# Patient Record
Sex: Female | Born: 1945
Health system: Southern US, Community
[De-identification: ages and names within clinical notes are randomized; demographics above are authoritative.]

## PROBLEM LIST (undated history)

## (undated) DIAGNOSIS — K219 Gastro-esophageal reflux disease without esophagitis: Secondary | ICD-10-CM

## (undated) DIAGNOSIS — I10 Essential (primary) hypertension: Secondary | ICD-10-CM

## (undated) DIAGNOSIS — E785 Hyperlipidemia, unspecified: Secondary | ICD-10-CM

## (undated) DIAGNOSIS — F419 Anxiety disorder, unspecified: Secondary | ICD-10-CM

## (undated) HISTORY — DX: Hyperlipidemia, unspecified: E78.5

## (undated) HISTORY — DX: Gastro-esophageal reflux disease without esophagitis: K21.9

## (undated) HISTORY — DX: Anxiety disorder, unspecified: F41.9

## (undated) HISTORY — PX: WRIST SURGERY: SHX841

## (undated) HISTORY — PX: OTHER SURGICAL HISTORY: SHX169

---

## 1998-11-09 ENCOUNTER — Ambulatory Visit (HOSPITAL_COMMUNITY): Admission: RE | Admit: 1998-11-09 | Discharge: 1998-11-09 | Payer: Self-pay | Admitting: Obstetrics & Gynecology

## 1998-11-09 ENCOUNTER — Encounter: Payer: Self-pay | Admitting: Obstetrics & Gynecology

## 1998-11-22 ENCOUNTER — Ambulatory Visit (HOSPITAL_COMMUNITY): Admission: RE | Admit: 1998-11-22 | Discharge: 1998-11-22 | Payer: Self-pay | Admitting: Obstetrics & Gynecology

## 1998-11-22 ENCOUNTER — Encounter: Payer: Self-pay | Admitting: Obstetrics and Gynecology

## 1999-04-20 ENCOUNTER — Ambulatory Visit (HOSPITAL_BASED_OUTPATIENT_CLINIC_OR_DEPARTMENT_OTHER): Admission: RE | Admit: 1999-04-20 | Discharge: 1999-04-20 | Payer: Self-pay | Admitting: Orthopedic Surgery

## 2001-10-22 ENCOUNTER — Other Ambulatory Visit: Admission: RE | Admit: 2001-10-22 | Discharge: 2001-10-22 | Payer: Self-pay | Admitting: Obstetrics and Gynecology

## 2004-11-11 ENCOUNTER — Other Ambulatory Visit: Admission: RE | Admit: 2004-11-11 | Discharge: 2004-11-11 | Payer: Self-pay | Admitting: Obstetrics and Gynecology

## 2011-01-13 ENCOUNTER — Encounter: Payer: Self-pay | Admitting: *Deleted

## 2011-01-13 ENCOUNTER — Emergency Department (INDEPENDENT_AMBULATORY_CARE_PROVIDER_SITE_OTHER)
Admission: EM | Admit: 2011-01-13 | Discharge: 2011-01-13 | Disposition: A | Discharge status: Home / Self Care | Payer: Medicare Other | Source: Home / Self Care | Attending: Family Medicine | Admitting: Family Medicine

## 2011-01-13 DIAGNOSIS — I10 Essential (primary) hypertension: Secondary | ICD-10-CM

## 2011-01-13 HISTORY — DX: Essential (primary) hypertension: I10

## 2011-01-13 NOTE — ED Notes (Signed)
Headache     Took  Sudafed   3  Days   Ago      Took  Bp   After  And  It  Was    190/81            Was  Seen    This  Am      Today      And  bp  meds  Increased        Still        Has  Headache          Has  Sensation     Of  Sinus  Congestion

## 2011-01-13 NOTE — ED Provider Notes (Signed)
History     CSN: [id number] Arrival date & time: 01/13/2011  8:31 PM   First MD Initiated Contact with Patient 01/13/11 1942      Chief Complaint  Patient presents with  . Sinusitis    (Consider location/radiation/quality/duration/timing/severity/associated sxs/prior treatment) HPI Comments: The patient presents with hx of cold symptoms . Took sudafed and then noted ha and elevated bp. Did not know there was a relationship. She also drinks caffeine. She has had some sinus congestion. No cough. No fever.   The history is provided by the patient.    Past Medical History  Diagnosis Date  . Hypertension     Past Surgical History  Procedure Date  . Wrist surgery     Family History  Problem Relation Age of Onset  . Hypertension Mother     History  Substance Use Topics  . Smoking status: Never Smoker   . Smokeless tobacco: Not on file  . Alcohol Use: Yes     Rarely    OB History    Grav Para Term Preterm Abortions TAB SAB Ect Mult Living                  Review of Systems  Constitutional: Negative.   Respiratory: Negative.   Cardiovascular: Negative.   Gastrointestinal: Negative.   Genitourinary: Negative.   Skin: Negative.     Allergies  Review of patient's allergies indicates no known allergies.  Home Medications   Current Outpatient Rx  Name Route Sig Dispense Refill  . ATENOLOL 25 MG PO TABS Oral Take 25 mg by mouth daily.      Marland Kitchen LISINOPRIL 20 MG PO TABS Oral Take 20 mg by mouth daily.        BP 172/84  Pulse 70  Temp(Src) 98.6 F (37 C) (Oral)  Resp 18  Physical Exam  Nursing note and vitals reviewed. Constitutional: She appears well-developed and well-nourished.  HENT:  Head: Normocephalic and atraumatic.  Neck: Normal range of motion. Neck supple.  Cardiovascular: Normal rate and regular rhythm.   Pulmonary/Chest: Effort normal and breath sounds normal.  Lymphadenopathy:    She has no cervical adenopathy.  Skin: Skin is warm and  dry.    ED Course  Procedures (including critical care time)  Labs Reviewed - No data to display No results found.   1. Hypertension       MDM          Randa Spike, MD 01/13/11 2126

## 2012-11-25 ENCOUNTER — Other Ambulatory Visit (HOSPITAL_COMMUNITY): Payer: Self-pay | Admitting: Internal Medicine

## 2012-11-25 DIAGNOSIS — Z1231 Encounter for screening mammogram for malignant neoplasm of breast: Secondary | ICD-10-CM

## 2012-12-10 ENCOUNTER — Ambulatory Visit (HOSPITAL_COMMUNITY)
Admission: RE | Admit: 2012-12-10 | Discharge: 2012-12-10 | Disposition: A | Discharge status: Home / Self Care | Payer: Medicare Other | Source: Ambulatory Visit | Attending: Internal Medicine | Admitting: Internal Medicine

## 2012-12-10 DIAGNOSIS — Z1231 Encounter for screening mammogram for malignant neoplasm of breast: Secondary | ICD-10-CM | POA: Insufficient documentation

## 2016-05-24 DIAGNOSIS — R69 Illness, unspecified: Secondary | ICD-10-CM | POA: Diagnosis not present

## 2016-07-27 DIAGNOSIS — H353121 Nonexudative age-related macular degeneration, left eye, early dry stage: Secondary | ICD-10-CM | POA: Diagnosis not present

## 2016-07-27 DIAGNOSIS — H353114 Nonexudative age-related macular degeneration, right eye, advanced atrophic with subfoveal involvement: Secondary | ICD-10-CM | POA: Diagnosis not present

## 2016-07-27 DIAGNOSIS — H353212 Exudative age-related macular degeneration, right eye, with inactive choroidal neovascularization: Secondary | ICD-10-CM | POA: Diagnosis not present

## 2016-07-27 DIAGNOSIS — H35051 Retinal neovascularization, unspecified, right eye: Secondary | ICD-10-CM | POA: Diagnosis not present

## 2016-09-27 DIAGNOSIS — R69 Illness, unspecified: Secondary | ICD-10-CM | POA: Diagnosis not present

## 2016-10-09 DIAGNOSIS — H2511 Age-related nuclear cataract, right eye: Secondary | ICD-10-CM | POA: Diagnosis not present

## 2016-10-09 DIAGNOSIS — H2512 Age-related nuclear cataract, left eye: Secondary | ICD-10-CM | POA: Diagnosis not present

## 2016-10-09 DIAGNOSIS — H353122 Nonexudative age-related macular degeneration, left eye, intermediate dry stage: Secondary | ICD-10-CM | POA: Diagnosis not present

## 2016-10-09 DIAGNOSIS — H353212 Exudative age-related macular degeneration, right eye, with inactive choroidal neovascularization: Secondary | ICD-10-CM | POA: Diagnosis not present

## 2016-11-01 DIAGNOSIS — H2511 Age-related nuclear cataract, right eye: Secondary | ICD-10-CM | POA: Diagnosis not present

## 2016-11-30 DIAGNOSIS — H2512 Age-related nuclear cataract, left eye: Secondary | ICD-10-CM | POA: Diagnosis not present

## 2017-04-11 DIAGNOSIS — R69 Illness, unspecified: Secondary | ICD-10-CM | POA: Diagnosis not present

## 2017-04-13 DIAGNOSIS — 419620001 Death: Secondary | SNOMED CT | POA: Diagnosis not present

## 2017-04-13 DEATH — deceased

## 2017-07-19 DIAGNOSIS — N8111 Cystocele, midline: Secondary | ICD-10-CM | POA: Diagnosis not present

## 2017-07-23 DIAGNOSIS — Z1231 Encounter for screening mammogram for malignant neoplasm of breast: Secondary | ICD-10-CM | POA: Diagnosis not present

## 2017-07-31 DIAGNOSIS — H35051 Retinal neovascularization, unspecified, right eye: Secondary | ICD-10-CM | POA: Diagnosis not present

## 2017-07-31 DIAGNOSIS — H353114 Nonexudative age-related macular degeneration, right eye, advanced atrophic with subfoveal involvement: Secondary | ICD-10-CM | POA: Diagnosis not present

## 2017-07-31 DIAGNOSIS — H353212 Exudative age-related macular degeneration, right eye, with inactive choroidal neovascularization: Secondary | ICD-10-CM | POA: Diagnosis not present

## 2017-07-31 DIAGNOSIS — H353121 Nonexudative age-related macular degeneration, left eye, early dry stage: Secondary | ICD-10-CM | POA: Diagnosis not present

## 2017-08-03 ENCOUNTER — Encounter (HOSPITAL_BASED_OUTPATIENT_CLINIC_OR_DEPARTMENT_OTHER): Payer: Self-pay | Admitting: *Deleted

## 2017-08-08 DIAGNOSIS — R69 Illness, unspecified: Secondary | ICD-10-CM | POA: Diagnosis not present

## 2017-08-22 DIAGNOSIS — Z0189 Encounter for other specified special examinations: Secondary | ICD-10-CM | POA: Diagnosis not present

## 2017-08-22 DIAGNOSIS — N8111 Cystocele, midline: Secondary | ICD-10-CM | POA: Diagnosis not present

## 2017-08-24 NOTE — Patient Instructions (Addendum)
Emily Booth  08/24/2017       Emily Booth  08/27/2017      Your procedure is scheduled on7/25/2019   Report to [facility] [place]  At  0530  A.M.  Call this number if you have problems the morning of surgery:[phone]  OUR ADDRESS IS [address], WE ARE LOCATED IN THE [facility]ICAL [facility] WITH [facility].   Remember:  Do not eat food or drink liquids after midnight.  Take these medicines the morning of surgery with A SIP OF WATER: Atenolol ( Tenormin)    Do not wear jewelry, make-up or nail polish.  Do not wear lotions, powders, or perfumes, or deoderant.  Do not shave 48 hours prior to surgery.  Men may shave face and neck.  Do not bring valuables to the hospital.  [facility] is not responsible for any belongings or valuables.  Contacts, dentures or bridgework may not be worn into surgery.  Leave your suitcase in the car.  After surgery it may be brought to your room.  For patients admitted to the hospital, discharge time will be determined by your treatment team.  Patients discharged the day of surgery will not be allowed to drive home.     Please read over the following fact sheets that you were given:        _____________________________________________________________________             [facility] - Preparing for Surgery Before surgery, you can play an important role.  Because skin is not sterile, your skin needs to be as free of germs as possible.  You can reduce the number of germs on your skin by washing with CHG (chlorahexidine gluconate) soap before surgery.  CHG is an antiseptic cleaner which kills germs and bonds with the skin to continue killing germs even after washing. Please DO NOT use if you have an allergy to CHG or antibacterial soaps.  If your skin becomes reddened/irritated stop using the CHG and inform your nurse when you arrive at Short Stay. Do not shave (including legs and underarms) for at  least 48 hours prior to the first CHG shower.  You may shave your face/neck. Please follow these instructions carefully:  1.  Shower with CHG Soap the night before surgery and the  morning of Surgery.  2.  If you choose to wash your hair, wash your hair first as usual with your  normal  shampoo.  3.  After you shampoo, rinse your hair and body thoroughly to remove the  shampoo.                           4.  Use CHG as you would any other liquid soap.  You can apply chg directly  to the skin and wash                       Gently with a scrungie or clean washcloth.  5.  Apply the CHG Soap to your body ONLY FROM THE NECK DOWN.   Do not use on face/ open                           Wound or open sores. Avoid contact with eyes, ears mouth and genitals (private parts).  Wash face,  Genitals (private parts) with your normal soap.             6.  Wash thoroughly, paying special attention to the area where your surgery  will be performed.  7.  Thoroughly rinse your body with warm water from the neck down.  8.  DO NOT shower/wash with your normal soap after using and rinsing off  the CHG Soap.                9.  Pat yourself dry with a clean towel.            10.  Wear clean pajamas.            11.  Place clean sheets on your bed the night of your first shower and do not  sleep with pets. Day of Surgery : Do not apply any lotions/deodorants the morning of surgery.  Please wear clean clothes to the hospital/surgery center.  FAILURE TO FOLLOW THESE INSTRUCTIONS MAY RESULT IN THE CANCELLATION OF YOUR SURGERY PATIENT SIGNATURE_________________________________  NURSE SIGNATURE__________________________________  ________________________________________________________________________   Emily MireIncentive Spirometer  An incentive spirometer is a tool that can help keep your lungs clear and active. This tool measures how well you are filling your lungs with each breath. Taking long deep breaths  may help reverse or decrease the chance of developing breathing (pulmonary) problems (especially infection) following:  A long period of time when you are unable to move or be active. BEFORE THE PROCEDURE   If the spirometer includes an indicator to show your best effort, your nurse or respiratory therapist will set it to a desired goal.  If possible, sit up straight or lean slightly forward. Try not to slouch.  Hold the incentive spirometer in an upright position. INSTRUCTIONS FOR USE  1. Sit on the edge of your bed if possible, or sit up as far as you can in bed or on a chair. 2. Hold the incentive spirometer in an upright position. 3. Breathe out normally. 4. Place the mouthpiece in your mouth and seal your lips tightly around it. 5. Breathe in slowly and as deeply as possible, raising the piston or the ball toward the top of the column. 6. Hold your breath for 3-5 seconds or for as long as possible. Allow the piston or ball to fall to the bottom of the column. 7. Remove the mouthpiece from your mouth and breathe out normally. 8. Rest for a few seconds and repeat Steps 1 through 7 at least 10 times every 1-2 hours when you are awake. Take your time and take a few normal breaths between deep breaths. 9. The spirometer may include an indicator to show your best effort. Use the indicator as a goal to work toward during each repetition. 10. After each set of 10 deep breaths, practice coughing to be sure your lungs are clear. If you have an incision (the cut made at the time of surgery), support your incision when coughing by placing a pillow or rolled up towels firmly against it. Once you are able to get out of bed, walk around indoors and cough well. You may stop using the incentive spirometer when instructed by your caregiver.  RISKS AND COMPLICATIONS  Take your time so you do not get dizzy or light-headed.  If you are in pain, you may need to take or ask for pain medication before doing  incentive spirometry. It is harder to take a deep breath if you are having  pain. AFTER USE  Rest and breathe slowly and easily.  It can be helpful to keep track of a log of your progress. Your caregiver can provide you with a simple table to help with this. If you are using the spirometer at home, follow these instructions: [place] IF:   You are having difficultly using the spirometer.  You have trouble using the spirometer as often as instructed.  Your pain medication is not giving enough relief while using the spirometer.  You develop fever of 100.5 F (38.1 C) or higher. SEEK IMMEDIATE MEDICAL CARE IF:   You cough up bloody sputum that had not been present before.  You develop fever of 102 F (38.9 C) or greater.  You develop worsening pain at or near the incision site. MAKE SURE YOU:   Understand these instructions.  Will watch your condition.  Will get help right away if you are not doing well or get worse. Document Released: 06/12/2006 Document Revised: 04/24/2011 Document Reviewed: 08/13/2006 [facility] Patient Information 2014 [place], [place].   ________________________________________________________________________

## 2017-08-27 ENCOUNTER — Encounter (HOSPITAL_COMMUNITY): Payer: Self-pay

## 2017-08-27 ENCOUNTER — Encounter (HOSPITAL_COMMUNITY)
Admission: RE | Admit: 2017-08-27 | Discharge: 2017-08-27 | Disposition: A | Discharge status: Home / Self Care | Payer: Medicare HMO | Source: Ambulatory Visit | Attending: Obstetrics and Gynecology | Admitting: Obstetrics and Gynecology

## 2017-08-27 ENCOUNTER — Other Ambulatory Visit: Payer: Self-pay

## 2017-08-27 DIAGNOSIS — N8189 Other female genital prolapse: Secondary | ICD-10-CM | POA: Diagnosis not present

## 2017-08-27 DIAGNOSIS — Z01812 Encounter for preprocedural laboratory examination: Secondary | ICD-10-CM | POA: Diagnosis not present

## 2017-08-27 DIAGNOSIS — Z01810 Encounter for preprocedural cardiovascular examination: Secondary | ICD-10-CM | POA: Insufficient documentation

## 2017-08-27 LAB — BASIC METABOLIC PANEL
Anion gap: 6 (ref 5–15)
BUN: 17 mg/dL (ref 8–23)
CALCIUM: 10 mg/dL (ref 8.9–10.3)
CO2: 25 mmol/L (ref 22–32)
CREATININE: 0.58 mg/dL (ref 0.44–1.00)
Chloride: 112 mmol/L — ABNORMAL HIGH (ref 98–111)
GFR calc non Af Amer: >60 mL/min (ref >60)
Glucose, Bld: 103 mg/dL — ABNORMAL HIGH (ref 70–99)
Potassium: 3.8 mmol/L (ref 3.5–5.1)
SODIUM: 143 mmol/L (ref 135–145)

## 2017-08-27 LAB — CBC
HCT: 41.5 % (ref 36.0–46.0)
Hemoglobin: 14.1 g/dL (ref 12.0–15.0)
MCH: 29.4 pg (ref 26.0–34.0)
MCHC: 34 g/dL (ref 30.0–36.0)
MCV: 86.6 fL (ref 78.0–100.0)
PLATELETS: 200 10*3/uL (ref 150–400)
RBC: 4.79 MIL/uL (ref 3.87–5.11)
RDW: 13.1 % (ref 11.5–15.5)
WBC: 7.1 10*3/uL (ref 4.0–10.5)

## 2017-09-03 DIAGNOSIS — Z09 Encounter for follow-up examination after completed treatment for conditions other than malignant neoplasm: Secondary | ICD-10-CM | POA: Diagnosis not present

## 2017-09-05 NOTE — Anesthesia Preprocedure Evaluation (Signed)
Anesthesia Evaluation  Patient identified by MRN, date of birth, ID band Patient awake    Reviewed: Allergy & Precautions, NPO status , Patient's Chart, lab work & pertinent test results  Airway Mallampati: II  TM Distance: >3 FB Neck ROM: Full    Dental no notable dental hx.    Pulmonary neg pulmonary ROS,    Pulmonary exam normal breath sounds clear to auscultation       Cardiovascular hypertension, Pt. on medications Normal cardiovascular exam Rhythm:Regular Rate:Normal     Neuro/Psych negative neurological ROS  negative psych ROS   GI/Hepatic negative GI ROS, Neg liver ROS,   Endo/Other  negative endocrine ROS  Renal/GU negative Renal ROS  negative genitourinary   Musculoskeletal negative musculoskeletal ROS (+)   Abdominal   Peds negative pediatric ROS (+)  Hematology negative hematology ROS (+)   Anesthesia Other Findings   Reproductive/Obstetrics negative OB ROS                             Anesthesia Physical Anesthesia Plan  ASA: II  Anesthesia Plan: General   Post-op Pain Management:    Induction: Intravenous  PONV Risk Score and Plan: 3 and Ondansetron, Dexamethasone, Treatment may vary due to age or medical condition and Scopolamine patch - Pre-op  Airway Management Planned:   Additional Equipment:   Intra-op Plan:   Post-operative Plan: Extubation in OR  Informed Consent: I have reviewed the patients History and Physical, chart, labs and discussed the procedure including the risks, benefits and alternatives for the proposed anesthesia with the patient or authorized representative who has indicated his/her understanding and acceptance.   Dental advisory given  Plan Discussed with: CRNA and Surgeon  Anesthesia Plan Comments:         Anesthesia Quick Evaluation

## 2017-09-05 NOTE — H&P (Signed)
Emily Booth is an 72 y.o. female. Feels like bladder is falling out more, having some urinary urgency off and on, no leak with strain, normal BM. No pain, minimal pressure.  Exam with significant cystocele and small rectocele.  Pertinent Gynecological History: Last mammogram: normal Date: 07/2017 Last pap: abnormal: ASCUS, +HPV, CIN I by colposcopy Date: 2014 OB History: G3, P3003 SVD x3   Menstrual History: No LMP recorded. Patient is postmenopausal.    Past Medical History:  Diagnosis Date  . Hypertension     Past Surgical History:  Procedure Laterality Date  . cataract surgery      bilateral   . WRIST SURGERY      Family History  Problem Relation Age of Onset  . Hypertension Mother     Social History:  reports that she has never smoked. She has never used smokeless tobacco. She reports that she drinks alcohol. She reports that she does not use drugs.  Allergies: No Known Allergies  No medications prior to admission.    Review of Systems  Respiratory: Negative.   Cardiovascular: Negative.     There were no vitals taken for this visit. Physical Exam  Constitutional: She appears well-developed and well-nourished.  Neck: Neck supple. No thyromegaly present.  Cardiovascular: Normal rate, regular rhythm and normal heart sounds.  No murmur heard. Respiratory: Effort normal and breath sounds normal. No respiratory distress. She has no wheezes.  GI: Soft. She exhibits no distension and no mass. There is no tenderness.  Genitourinary: Uterus normal.  Genitourinary Comments: Gr II-III cystocele Gr I rectocele Uterine descensus No adnexal mass    No results found for this or any previous visit (from the past 24 hour(s)).  No results found.  Assessment/Plan: Symptomatic cystocele with small rectocele and uterine descensus.  Medical and surgical options discussed.  Surgical procedure and risks, as well as chances of relieving symptoms discussed.  Will admit  for TVH, A&P repair, bilateral salpingectomy and cystoscopy.    Emily Booth 09/05/2017, 8:15 PM

## 2017-09-06 ENCOUNTER — Encounter (HOSPITAL_BASED_OUTPATIENT_CLINIC_OR_DEPARTMENT_OTHER)
Admission: RE | Disposition: A | Discharge status: Home / Self Care | Payer: Self-pay | Source: Ambulatory Visit | Attending: Obstetrics and Gynecology

## 2017-09-06 ENCOUNTER — Ambulatory Visit (HOSPITAL_BASED_OUTPATIENT_CLINIC_OR_DEPARTMENT_OTHER)
Admission: RE | Admit: 2017-09-06 | Discharge: 2017-09-07 | Disposition: A | Discharge status: Home / Self Care | Payer: Medicare HMO | Source: Ambulatory Visit | Attending: Obstetrics and Gynecology | Admitting: Obstetrics and Gynecology

## 2017-09-06 ENCOUNTER — Ambulatory Visit (HOSPITAL_BASED_OUTPATIENT_CLINIC_OR_DEPARTMENT_OTHER): Payer: Medicare HMO | Admitting: Anesthesiology

## 2017-09-06 ENCOUNTER — Encounter (HOSPITAL_BASED_OUTPATIENT_CLINIC_OR_DEPARTMENT_OTHER): Payer: Self-pay

## 2017-09-06 DIAGNOSIS — Z79899 Other long term (current) drug therapy: Secondary | ICD-10-CM | POA: Diagnosis not present

## 2017-09-06 DIAGNOSIS — I10 Essential (primary) hypertension: Secondary | ICD-10-CM | POA: Diagnosis not present

## 2017-09-06 DIAGNOSIS — N814 Uterovaginal prolapse, unspecified: Secondary | ICD-10-CM | POA: Diagnosis present

## 2017-09-06 DIAGNOSIS — N813 Complete uterovaginal prolapse: Secondary | ICD-10-CM | POA: Insufficient documentation

## 2017-09-06 DIAGNOSIS — N858 Other specified noninflammatory disorders of uterus: Secondary | ICD-10-CM | POA: Diagnosis not present

## 2017-09-06 DIAGNOSIS — Z90710 Acquired absence of both cervix and uterus: Secondary | ICD-10-CM | POA: Diagnosis present

## 2017-09-06 DIAGNOSIS — Z9049 Acquired absence of other specified parts of digestive tract: Secondary | ICD-10-CM | POA: Insufficient documentation

## 2017-09-06 DIAGNOSIS — N8110 Cystocele, unspecified: Secondary | ICD-10-CM | POA: Diagnosis not present

## 2017-09-06 DIAGNOSIS — N816 Rectocele: Secondary | ICD-10-CM | POA: Diagnosis not present

## 2017-09-06 DIAGNOSIS — N8111 Cystocele, midline: Secondary | ICD-10-CM | POA: Diagnosis not present

## 2017-09-06 HISTORY — PX: VAGINAL HYSTERECTOMY: SHX2639

## 2017-09-06 HISTORY — PX: CYSTOSCOPY: SHX5120

## 2017-09-06 HISTORY — PX: ANTERIOR AND POSTERIOR REPAIR: SHX5121

## 2017-09-06 SURGERY — HYSTERECTOMY, VAGINAL
Anesthesia: General

## 2017-09-06 MED ORDER — KETOROLAC TROMETHAMINE 30 MG/ML IJ SOLN
INTRAMUSCULAR | Status: DC | PRN
  Administered 2017-09-06: 15 mg via INTRAVENOUS

## 2017-09-06 MED ORDER — LACTATED RINGERS IV SOLN
INTRAVENOUS | Status: DC
  Administered 2017-09-06: 75 mL/h via INTRAVENOUS
  Filled 2017-09-06: qty 1000

## 2017-09-06 MED ORDER — LISINOPRIL 20 MG PO TABS
20.0000 mg | ORAL_TABLET | Freq: Every day | ORAL | Status: DC
  Administered 2017-09-06: 20 mg via ORAL
  Filled 2017-09-06: qty 1

## 2017-09-06 MED ORDER — HYDROMORPHONE HCL 1 MG/ML IJ SOLN
0.2500 mg | INTRAMUSCULAR | Status: DC | PRN
  Filled 2017-09-06: qty 0.5

## 2017-09-06 MED ORDER — LIDOCAINE 2% (20 MG/ML) 5 ML SYRINGE
INTRAMUSCULAR | Status: AC
  Filled 2017-09-06: qty 5

## 2017-09-06 MED ORDER — LACTATED RINGERS IV SOLN
INTRAVENOUS | Status: DC
  Administered 2017-09-06: 07:00:00 via INTRAVENOUS
  Filled 2017-09-06: qty 1000

## 2017-09-06 MED ORDER — CEFAZOLIN SODIUM-DEXTROSE 2-4 GM/100ML-% IV SOLN
2.0000 g | INTRAVENOUS | Status: AC
  Administered 2017-09-06: 2 g via INTRAVENOUS
  Filled 2017-09-06: qty 100

## 2017-09-06 MED ORDER — DEXAMETHASONE SODIUM PHOSPHATE 10 MG/ML IJ SOLN
INTRAMUSCULAR | Status: DC | PRN
  Administered 2017-09-06: 10 mg via INTRAVENOUS

## 2017-09-06 MED ORDER — KETOROLAC TROMETHAMINE 30 MG/ML IJ SOLN
30.0000 mg | Freq: Once | INTRAMUSCULAR | Status: DC
  Filled 2017-09-06: qty 1

## 2017-09-06 MED ORDER — VASOPRESSIN 20 UNIT/ML IV SOLN
INTRAVENOUS | Status: DC | PRN
  Administered 2017-09-06: 15 mL via INTRAMUSCULAR

## 2017-09-06 MED ORDER — KETOROLAC TROMETHAMINE 30 MG/ML IJ SOLN
30.0000 mg | Freq: Four times a day (QID) | INTRAMUSCULAR | Status: DC
  Administered 2017-09-06 – 2017-09-07 (×3): 30 mg via INTRAVENOUS
  Filled 2017-09-06: qty 1

## 2017-09-06 MED ORDER — ONDANSETRON HCL 4 MG/2ML IJ SOLN
INTRAMUSCULAR | Status: AC
  Filled 2017-09-06: qty 2

## 2017-09-06 MED ORDER — DEXTROSE-NACL 5-0.45 % IV SOLN
INTRAVENOUS | Status: DC
  Administered 2017-09-06 (×2): via INTRAVENOUS
  Filled 2017-09-06 (×2): qty 1000

## 2017-09-06 MED ORDER — KETOROLAC TROMETHAMINE 30 MG/ML IJ SOLN
30.0000 mg | Freq: Four times a day (QID) | INTRAMUSCULAR | Status: DC
  Filled 2017-09-06: qty 1

## 2017-09-06 MED ORDER — ONDANSETRON HCL 4 MG/2ML IJ SOLN
INTRAMUSCULAR | Status: DC | PRN
  Administered 2017-09-06: 4 mg via INTRAVENOUS

## 2017-09-06 MED ORDER — SODIUM CHLORIDE 0.9 % IJ SOLN
INTRAMUSCULAR | Status: AC
  Filled 2017-09-06: qty 50

## 2017-09-06 MED ORDER — FENTANYL CITRATE (PF) 100 MCG/2ML IJ SOLN
INTRAMUSCULAR | Status: DC | PRN
  Administered 2017-09-06: 50 ug via INTRAVENOUS
  Administered 2017-09-06: 100 ug via INTRAVENOUS

## 2017-09-06 MED ORDER — ONDANSETRON HCL 4 MG/2ML IJ SOLN
4.0000 mg | Freq: Four times a day (QID) | INTRAMUSCULAR | Status: DC | PRN
  Filled 2017-09-06: qty 2

## 2017-09-06 MED ORDER — SIMETHICONE 80 MG PO CHEW
80.0000 mg | CHEWABLE_TABLET | Freq: Four times a day (QID) | ORAL | Status: DC | PRN
  Filled 2017-09-06: qty 1

## 2017-09-06 MED ORDER — VASOPRESSIN 20 UNIT/ML IV SOLN
INTRAVENOUS | Status: AC
  Filled 2017-09-06: qty 1

## 2017-09-06 MED ORDER — CEFAZOLIN SODIUM-DEXTROSE 2-4 GM/100ML-% IV SOLN
INTRAVENOUS | Status: AC
  Filled 2017-09-06: qty 100

## 2017-09-06 MED ORDER — ESTRADIOL 0.1 MG/GM VA CREA
TOPICAL_CREAM | VAGINAL | Status: DC | PRN
  Administered 2017-09-06: 1 via VAGINAL

## 2017-09-06 MED ORDER — KETOROLAC TROMETHAMINE 30 MG/ML IJ SOLN
INTRAMUSCULAR | Status: AC
  Filled 2017-09-06: qty 1

## 2017-09-06 MED ORDER — PROPOFOL 10 MG/ML IV BOLUS
INTRAVENOUS | Status: DC | PRN
  Administered 2017-09-06: 150 mg via INTRAVENOUS

## 2017-09-06 MED ORDER — OXYCODONE HCL 5 MG PO TABS
5.0000 mg | ORAL_TABLET | Freq: Once | ORAL | Status: DC | PRN
  Filled 2017-09-06: qty 1

## 2017-09-06 MED ORDER — ONDANSETRON HCL 4 MG PO TABS
4.0000 mg | ORAL_TABLET | Freq: Four times a day (QID) | ORAL | Status: DC | PRN
  Filled 2017-09-06: qty 1

## 2017-09-06 MED ORDER — ESTRADIOL 0.1 MG/GM VA CREA
TOPICAL_CREAM | VAGINAL | Status: AC
  Filled 2017-09-06: qty 42.5

## 2017-09-06 MED ORDER — BUPIVACAINE HCL (PF) 0.5 % IJ SOLN
INTRAMUSCULAR | Status: DC | PRN
  Administered 2017-09-06: 15 mL

## 2017-09-06 MED ORDER — PROPOFOL 10 MG/ML IV BOLUS
INTRAVENOUS | Status: AC
  Filled 2017-09-06: qty 40

## 2017-09-06 MED ORDER — ALUM & MAG HYDROXIDE-SIMETH 200-200-20 MG/5ML PO SUSP
30.0000 mL | ORAL | Status: DC | PRN
  Filled 2017-09-06: qty 30

## 2017-09-06 MED ORDER — MENTHOL 3 MG MT LOZG
1.0000 | LOZENGE | OROMUCOSAL | Status: DC | PRN
  Filled 2017-09-06: qty 9

## 2017-09-06 MED ORDER — BUPIVACAINE HCL (PF) 0.5 % IJ SOLN
INTRAMUSCULAR | Status: AC
  Filled 2017-09-06: qty 30

## 2017-09-06 MED ORDER — ROCURONIUM BROMIDE 100 MG/10ML IV SOLN
INTRAVENOUS | Status: AC
Start: 2017-09-06 — End: ?
  Filled 2017-09-06: qty 1

## 2017-09-06 MED ORDER — IBUPROFEN 600 MG PO TABS
600.0000 mg | ORAL_TABLET | Freq: Four times a day (QID) | ORAL | Status: DC | PRN
  Filled 2017-09-06: qty 1

## 2017-09-06 MED ORDER — LIDOCAINE 2% (20 MG/ML) 5 ML SYRINGE
INTRAMUSCULAR | Status: DC | PRN
  Administered 2017-09-06: 60 mg via INTRAVENOUS

## 2017-09-06 MED ORDER — PROMETHAZINE HCL 25 MG/ML IJ SOLN
6.2500 mg | INTRAMUSCULAR | Status: DC | PRN
  Filled 2017-09-06: qty 1

## 2017-09-06 MED ORDER — FENTANYL CITRATE (PF) 250 MCG/5ML IJ SOLN
INTRAMUSCULAR | Status: AC
Start: 2017-09-06 — End: ?
  Filled 2017-09-06: qty 5

## 2017-09-06 MED ORDER — OXYCODONE HCL 5 MG/5ML PO SOLN
5.0000 mg | Freq: Once | ORAL | Status: DC | PRN
  Filled 2017-09-06: qty 5

## 2017-09-06 MED ORDER — HYDROMORPHONE HCL 1 MG/ML IJ SOLN
1.0000 mg | INTRAMUSCULAR | Status: DC | PRN
  Filled 2017-09-06: qty 1

## 2017-09-06 MED ORDER — DEXAMETHASONE SODIUM PHOSPHATE 10 MG/ML IJ SOLN
INTRAMUSCULAR | Status: AC
  Filled 2017-09-06: qty 1

## 2017-09-06 SURGICAL SUPPLY — 45 items
BLADE SURG 15 STRL LF DISP TIS (BLADE) IMPLANT
BLADE SURG 15 STRL SS (BLADE)
CANISTER SUCT 3000ML PPV (MISCELLANEOUS) ×2 IMPLANT
CATH ROBINSON RED A/P 16FR (CATHETERS) ×2 IMPLANT
CLOTH BEACON ORANGE TIMEOUT ST (SAFETY) ×2 IMPLANT
DECANTER SPIKE VIAL GLASS SM (MISCELLANEOUS) ×1 IMPLANT
GAUZE PACKING 2X5 YD STRL (GAUZE/BANDAGES/DRESSINGS) ×2 IMPLANT
GLOVE BIO SURGEON STRL SZ8 (GLOVE) ×2 IMPLANT
GLOVE BIOGEL PI IND STRL 6.5 (GLOVE) ×1 IMPLANT
GLOVE BIOGEL PI IND STRL 7.0 (GLOVE) ×1 IMPLANT
GLOVE BIOGEL PI IND STRL 8 (GLOVE) ×1 IMPLANT
GLOVE BIOGEL PI INDICATOR 6.5 (GLOVE) ×1
GLOVE BIOGEL PI INDICATOR 7.0 (GLOVE) ×1
GLOVE BIOGEL PI INDICATOR 8 (GLOVE) ×1
GLOVE ORTHO TXT STRL SZ7.5 (GLOVE) ×2 IMPLANT
GOWN STRL REUS W/TWL LRG LVL3 (GOWN DISPOSABLE) ×6 IMPLANT
GOWN STRL REUS W/TWL XL LVL3 (GOWN DISPOSABLE) ×2 IMPLANT
NDL MAYO CATGUT SZ4 TPR NDL (NEEDLE) IMPLANT
NEEDLE HYPO 22GX1.5 SAFETY (NEEDLE) IMPLANT
NEEDLE MAYO CATGUT SZ4 (NEEDLE) IMPLANT
NS IRRIG 1000ML POUR BTL (IV SOLUTION) ×2 IMPLANT
NS IRRIG 500ML POUR BTL (IV SOLUTION) ×2 IMPLANT
PACK TRENDGUARD 450 HYBRID PRO (MISCELLANEOUS) IMPLANT
PACK VAGINAL WOMENS (CUSTOM PROCEDURE TRAY) ×2 IMPLANT
PAD OB MATERNITY 4.3X12.25 (PERSONAL CARE ITEMS) ×2 IMPLANT
SET IRRIG Y TYPE TUR BLADDER L (SET/KITS/TRAYS/PACK) IMPLANT
SHEARS FOC LG CVD HARMONIC 17C (MISCELLANEOUS) ×2 IMPLANT
SUT CHROMIC 1 TIES 18 (SUTURE) ×2 IMPLANT
SUT CHROMIC 1MO 4 18 CR8 (SUTURE) ×2 IMPLANT
SUT PROLENE 1 CTX 30  8455H (SUTURE)
SUT PROLENE 1 CTX 30 8455H (SUTURE) IMPLANT
SUT SILK 0 SH 30 (SUTURE) IMPLANT
SUT SILK 2 0 SH (SUTURE) ×2 IMPLANT
SUT VIC AB 2-0 CT1 (SUTURE) ×3 IMPLANT
SUT VIC AB 2-0 CT1 27 (SUTURE) ×2
SUT VIC AB 2-0 CT1 TAPERPNT 27 (SUTURE) ×1 IMPLANT
SUT VIC AB 2-0 CT2 27 (SUTURE) ×10 IMPLANT
SUT VIC AB 3-0 CT1 27 (SUTURE)
SUT VIC AB 3-0 CT1 TAPERPNT 27 (SUTURE) IMPLANT
SUT VIC AB 3-0 SH 27 (SUTURE) ×4
SUT VIC AB 3-0 SH 27X BRD (SUTURE) IMPLANT
SUT VICRYL 0 UR6 27IN ABS (SUTURE) IMPLANT
TOWEL OR 17X24 6PK STRL BLUE (TOWEL DISPOSABLE) ×4 IMPLANT
TRAY FOLEY W/BAG SLVR 14FR (SET/KITS/TRAYS/PACK) ×2 IMPLANT
TRENDGUARD 450 HYBRID PRO PACK (MISCELLANEOUS)

## 2017-09-06 NOTE — Anesthesia Postprocedure Evaluation (Signed)
Anesthesia Post Note  Patient: Emily Booth  Procedure(s) Performed: HYSTERECTOMY VAGINAL (N/A ) ANTERIOR (CYSTOCELE) AND POSTERIOR REPAIR (RECTOCELE) (N/A ) CYSTOSCOPY (N/A )     Patient location during evaluation: PACU Anesthesia Type: General Level of consciousness: awake and alert Pain management: pain level controlled Vital Signs Assessment: post-procedure vital signs reviewed and stable Respiratory status: spontaneous breathing, nonlabored ventilation, respiratory function stable and patient connected to nasal cannula oxygen Cardiovascular status: blood pressure returned to baseline and stable Postop Assessment: no apparent nausea or vomiting Anesthetic complications: no    Last Vitals:  Vitals:   09/06/17 1000 09/06/17 1015  BP: (!) 141/77 (!) 142/68  Pulse: 77 70  Resp: 11 13  Temp:    SpO2: 99% 99%    Last Pain:  Vitals:   09/06/17 1000  TempSrc:   PainSc: 5                  Loys Shugars S

## 2017-09-06 NOTE — Progress Notes (Signed)
Post-op check, s/p TVH, A&P repair Doing well, minimal pain, has ambulated, no n/v Afeb, VSS, BP improved Adequate clear urine Abd- soft Continue routine care, will remove packing and foley in am

## 2017-09-06 NOTE — Op Note (Signed)
Preoperative diagnosis: Symptomatic cystocele Postop diagnosis: Same with uterine descensus and small rectocele Procedure: Total vaginal hysterectomy, A&P repair and cystoscopy Surgeon: Lavina Hammanodd Julanne Schlueter M.D. Assistant: Doristine Counterecelia Banga, D.O. Anesthesia: Gen. With an LMA Findings: Patient had a small uterus with normal tubes and ovaries. Gr II-III cystocele, Gr I rectocele. Via cystoscopy bladder was normal and both ureters were patent Estimated blood loss: 100 cc Specimens: Uterus for routine pathology Complications: None  Procedure in detail: The patient was taken to the operating room and placed in the dorsosupine position. General anesthesia was induced and she was placed in mobile stirrups. Legs were elevated in the stirrups. Perineum and vagina were then prepped and draped in the usual sterile fashion, bladder drained with red Robinson catheter. A Graves speculum was inserted in the vagina and the cervix was grasped with Christella HartiganJacobs tenaculum. Dilute Pitressin with Marcaine was then instilled at the cervicovaginal junction which was then incised circumferentially with electrocautery. Sharp dissection was then used to further free the vagina from the cervix. Anterior peritoneum was difficult to identify and was not initially entered. A Deaver retractor was used to retract the bladder anteriorly. Posterior cul-de-sac was identified and entered sharply. A Bonnano speculum was placed into the posterior cul-de-sac. Uterosacral ligaments were clamped transected and ligated with #1 chromic and tagged for later use. Remaining pedicles were taken down with Harmonic scalpel with adequate hemostasis.  The uterine fundus was grasped and delivered posteriorly, tubes were adherent and not able to be removed.  Utero-ovarian pedicles were then taken down with harmonic scalpel and the uterus was removed.  Tubes and ovaries were inspected and found to be normal.  No significant bleeding was identified. The vagina was then  closed in a vertical fashion with running locking 2-0 Vicryl with adequate closure and adequate hemostasis.  The vaginal cuff was grasped with 2 Allis clamps. The large cystocele was then mobilized by dissecting anteriorly in the midline from the vaginal cuff to within about 2 cm of the urethral meatus in the midline. Cystocele was mobilized laterally. Cystocele was then reduced with several interrupted sutures of 2-0 Vicryl with adequate reduction. A significant amount of excess vaginal tissue was removed and the incision was closed with running locking 2-0 Vicryl with adequate closure and adequate hemostasis. The uterosacral ligaments were then plicated in the midline with 2-0 silk after the Bonnano speculum was removed and a shorter vaginal speculum was placed. The previously tagged uterosacral pedicles were also tied in the midline.  An anchoring suture was also placed with #1 Chromic to secure the vagina to the uterosacral ligaments.  The remaining vagina was closed with running locking 2-0 Vicryl with adequate closure and hemostasis.   Attention was now turned to the rectocele repair. The hymenal ring was grasped with 2 Allis clamps at a distance that when brought together would still allow two fingers to  easily pass into the vagina. A horizontal piece of tissue was removed. The posterior vaginal mucosa was then dissected in the midline from the hymenal ring to the vaginal apex. Rectocele was then mobilized bilaterally bluntly. The rectocele was then reduced with interrupted sutures of 2-0 Vicryl. This achieved good reduction. A  rectal exam confirmed good reduction of the rectocele. Excess vaginal mucosa was then removed sharply. Vagina was closed from the vaginal apex to the hymenal ring with running locking 2-0 Vicryl.  This achieved adequate closure and adequate hemostasis.   Attention was turned to cystoscopy. A 70 cystoscope was inserted and 200 cc of  fluid was instilled. The bladder appeared  normal. Both ureteral orifices were  identified and urine was seen to flow freely from each orifice. The cystoscope was removed and a Foley catheter was placed. The vagina was then packed with 2 inch gauze coated with Estrace cream. The patient tolerated the procedure well. She was awakened and taken to the recovery in stable condition. Counts were correct, she had PAS hose on throughout the procedure. She received Ancef 2 g for surgical prophylaxis.

## 2017-09-06 NOTE — Interval H&P Note (Signed)
History and Physical Interval Note:  09/06/2017 7:13 AM  Emily Booth  has presented today for surgery, with the diagnosis of Prolapse  The various methods of treatment have been discussed with the patient and family. After consideration of risks, benefits and other options for treatment, the patient has consented to  Procedure(s): HYSTERECTOMY VAGINAL (N/A) ANTERIOR (CYSTOCELE) AND POSTERIOR REPAIR (RECTOCELE) (N/A) CYSTOSCOPY (N/A) as a surgical intervention .  The patient's history has been reviewed, patient examined, no change in status, stable for surgery.  I have reviewed the patient's chart and labs.  Questions were answered to the patient's satisfaction.     Leighton Roachodd D Gilberte Gorley

## 2017-09-06 NOTE — Transfer of Care (Signed)
Immediate Anesthesia Transfer of Care Note  Patient: Earlean ShawlChristine F Woodcox  Procedure(s) Performed: HYSTERECTOMY VAGINAL (N/A ) ANTERIOR (CYSTOCELE) AND POSTERIOR REPAIR (RECTOCELE) (N/A ) CYSTOSCOPY (N/A )  Patient Location: PACU  Anesthesia Type:General  Level of Consciousness: awake, alert  and oriented  Airway & Oxygen Therapy: Patient Spontanous Breathing and Patient connected to nasal cannula oxygen  Post-op Assessment: Report given to RN  Post vital signs: Reviewed and stable  Last Vitals: 141/68, 77, 11, 99% Vitals Value Taken Time  BP    Temp    Pulse    Resp    SpO2      Last Pain:  Vitals:   09/06/17 0616  TempSrc:   PainSc: 0-No pain      Patients Stated Pain Goal: 5 (09/06/17 0616)  Complications: No apparent anesthesia complications

## 2017-09-06 NOTE — Anesthesia Procedure Notes (Signed)
Procedure Name: LMA Insertion Date/Time: 09/06/2017 7:42 AM Performed by: Briant Sitesenenny, Alba Kriesel T, CRNA Pre-anesthesia Checklist: Patient identified, Emergency Drugs available, Suction available and Patient being monitored Patient Re-evaluated:Patient Re-evaluated prior to induction Oxygen Delivery Method: Circle system utilized Preoxygenation: Pre-oxygenation with 100% oxygen Induction Type: IV induction Ventilation: Mask ventilation without difficulty LMA: LMA inserted LMA Size: 4.0 Number of attempts: 1 Airway Equipment and Method: Bite block Placement Confirmation: positive ETCO2 Tube secured with: Tape Dental Injury: Teeth and Oropharynx as per pre-operative assessment

## 2017-09-07 ENCOUNTER — Encounter (HOSPITAL_BASED_OUTPATIENT_CLINIC_OR_DEPARTMENT_OTHER): Payer: Self-pay | Admitting: Obstetrics and Gynecology

## 2017-09-07 DIAGNOSIS — I10 Essential (primary) hypertension: Secondary | ICD-10-CM | POA: Diagnosis not present

## 2017-09-07 DIAGNOSIS — Z79899 Other long term (current) drug therapy: Secondary | ICD-10-CM | POA: Diagnosis not present

## 2017-09-07 DIAGNOSIS — Z9049 Acquired absence of other specified parts of digestive tract: Secondary | ICD-10-CM | POA: Diagnosis not present

## 2017-09-07 DIAGNOSIS — N813 Complete uterovaginal prolapse: Secondary | ICD-10-CM | POA: Diagnosis not present

## 2017-09-07 LAB — CBC
HEMATOCRIT: 36.8 % (ref 36.0–46.0)
HEMOGLOBIN: 12.5 g/dL (ref 12.0–15.0)
MCH: 29.8 pg (ref 26.0–34.0)
MCHC: 34 g/dL (ref 30.0–36.0)
MCV: 87.6 fL (ref 78.0–100.0)
Platelets: 171 10*3/uL (ref 150–400)
RBC: 4.2 MIL/uL (ref 3.87–5.11)
RDW: 13.2 % (ref 11.5–15.5)
WBC: 14.8 10*3/uL — ABNORMAL HIGH (ref 4.0–10.5)

## 2017-09-07 MED ORDER — KETOROLAC TROMETHAMINE 30 MG/ML IJ SOLN
INTRAMUSCULAR | Status: AC
  Filled 2017-09-07: qty 1

## 2017-09-07 MED ORDER — IBUPROFEN 600 MG PO TABS: 600.0000 mg | ORAL_TABLET | Freq: Four times a day (QID) | ORAL | 0 refills | Status: DC | PRN

## 2017-09-07 NOTE — Progress Notes (Signed)
1 Day Post-Op Procedure(s) (LRB): HYSTERECTOMY VAGINAL (N/A) ANTERIOR (CYSTOCELE) AND POSTERIOR REPAIR (RECTOCELE) (N/A) CYSTOSCOPY (N/A)  Subjective: Patient reports tolerating PO.  Minimal pain, no problems ambulating  Objective: I have reviewed patient's vital signs, intake and output and labs.  General: alert GI: soft, NT Vaginal Bleeding: minimal, packing and foley removed  Assessment: s/p Procedure(s): HYSTERECTOMY VAGINAL (N/A) ANTERIOR (CYSTOCELE) AND POSTERIOR REPAIR (RECTOCELE) (N/A) CYSTOSCOPY (N/A): stable and progressing well  Plan: Advance diet Encourage ambulation Advance to PO medication Discontinue IV fluids Discharge home  LOS: 0 days    Emily Booth 09/07/2017, 8:29 AM

## 2017-09-07 NOTE — Progress Notes (Signed)
09/07/2017 10:14 AM D/c avs form, medications already taken today and those due this evening given and explained to patient. Follow up appointments and when to call MD reviewed. RX reviewed. D/c iv. Pt. Able to void without difficulty prior to discharge. D/c home per orders.  Emily Booth, Blanchard KelchStephanie Ingold

## 2017-09-07 NOTE — Discharge Instructions (Signed)
Routine instructions for vaginal hysterectomy, A&P repair Please take OTC stool softener daily for the next month

## 2017-09-07 NOTE — Discharge Summary (Signed)
Physician Discharge Summary  Patient ID: Emily ShawlChristine F Wyble MRN: [id number] DOB/AGE: Sep 23, 1945 72 y.o.  Admit date: 09/06/2017 Discharge date: 09/07/2017  Admission Diagnoses:  Symptomatic cystocele  Discharge Diagnoses: Same, small rectocele and uterine descensus Active Problems:   Cystocele with prolapse   S/P vaginal hysterectomy   Discharged Condition: good  Hospital Course: Pt underwent TVH, A&P repair and cystoscopy without complications.  Did very well post-op, minimal pain.  Vaginal packing and foley removed POD #1, she was able to void and was discharged home.  Discharge Exam: Blood pressure (!) 113/58, pulse 67, temperature 98.4 F (36.9 C), temperature source Oral, resp. rate 16, height 5\' 6"  (1.676 m), weight 63.2 kg (139 lb 4.8 oz), SpO2 97 %. General appearance: alert  Disposition:    Allergies as of 09/07/2017   No Known Allergies     Medication List    TAKE these medications   ibuprofen 600 MG tablet Commonly known as:  ADVIL,MOTRIN Take 1 tablet (600 mg total) by mouth every 6 (six) hours as needed (mild pain).   lisinopril 20 MG tablet Commonly known as:  PRINIVIL,ZESTRIL Take 20 mg by mouth daily.   multivitamin tablet Take 1 tablet by mouth daily.      Follow-up Information    Tekoa Amon, MD. Schedule an appointment as soon as possible for a visit in 2 week(s).   Specialty:  Obstetrics and Gynecology Contact information: [address], [address] [place] [address] [phone]           Signed: Leighton Roachodd D Tisheena Maguire 09/07/2017, 8:32 AM

## 2017-09-19 ENCOUNTER — Ambulatory Visit (HOSPITAL_COMMUNITY)
Admission: RE | Admit: 2017-09-19 | Discharge: 2017-09-19 | Disposition: A | Discharge status: Home / Self Care | Payer: Medicare HMO | Source: Ambulatory Visit | Attending: Internal Medicine | Admitting: Internal Medicine

## 2017-09-19 ENCOUNTER — Encounter (HOSPITAL_COMMUNITY): Payer: Self-pay | Admitting: Radiology

## 2017-09-19 ENCOUNTER — Other Ambulatory Visit (HOSPITAL_COMMUNITY): Payer: Self-pay | Admitting: Internal Medicine

## 2017-09-19 DIAGNOSIS — I700 Atherosclerosis of aorta: Secondary | ICD-10-CM | POA: Insufficient documentation

## 2017-09-19 DIAGNOSIS — R079 Chest pain, unspecified: Secondary | ICD-10-CM | POA: Insufficient documentation

## 2017-09-19 DIAGNOSIS — Z90710 Acquired absence of both cervix and uterus: Secondary | ICD-10-CM | POA: Diagnosis not present

## 2017-09-19 DIAGNOSIS — R0602 Shortness of breath: Secondary | ICD-10-CM

## 2017-09-19 DIAGNOSIS — R000 Tachycardia, unspecified: Secondary | ICD-10-CM | POA: Diagnosis not present

## 2017-09-19 MED ORDER — IOPAMIDOL (ISOVUE-370) INJECTION 76%
INTRAVENOUS | Status: AC
  Filled 2017-09-19: qty 100

## 2017-09-19 MED ORDER — IOPAMIDOL (ISOVUE-370) INJECTION 76%
100.0000 mL | Freq: Once | INTRAVENOUS | Status: AC | PRN
  Administered 2017-09-19: 100 mL via INTRAVENOUS

## 2017-09-21 DIAGNOSIS — R69 Illness, unspecified: Secondary | ICD-10-CM | POA: Diagnosis not present

## 2017-09-21 DIAGNOSIS — E785 Hyperlipidemia, unspecified: Secondary | ICD-10-CM | POA: Diagnosis not present

## 2017-09-21 DIAGNOSIS — R000 Tachycardia, unspecified: Secondary | ICD-10-CM | POA: Diagnosis not present

## 2017-09-21 DIAGNOSIS — R7303 Prediabetes: Secondary | ICD-10-CM | POA: Diagnosis not present

## 2017-09-21 DIAGNOSIS — R9389 Abnormal findings on diagnostic imaging of other specified body structures: Secondary | ICD-10-CM | POA: Diagnosis not present

## 2017-09-21 DIAGNOSIS — Z5181 Encounter for therapeutic drug level monitoring: Secondary | ICD-10-CM | POA: Diagnosis not present

## 2017-09-28 ENCOUNTER — Other Ambulatory Visit: Payer: Self-pay | Admitting: Internal Medicine

## 2017-09-29 ENCOUNTER — Other Ambulatory Visit: Payer: Self-pay | Admitting: Internal Medicine

## 2017-09-29 DIAGNOSIS — R9389 Abnormal findings on diagnostic imaging of other specified body structures: Secondary | ICD-10-CM

## 2017-10-18 ENCOUNTER — Ambulatory Visit
Admission: RE | Admit: 2017-10-18 | Discharge: 2017-10-18 | Disposition: A | Discharge status: Home / Self Care | Payer: Medicare HMO | Source: Ambulatory Visit | Attending: Internal Medicine | Admitting: Internal Medicine

## 2017-10-18 DIAGNOSIS — K862 Cyst of pancreas: Secondary | ICD-10-CM | POA: Diagnosis not present

## 2017-10-18 DIAGNOSIS — R9389 Abnormal findings on diagnostic imaging of other specified body structures: Secondary | ICD-10-CM

## 2017-10-18 MED ORDER — GADOBENATE DIMEGLUMINE 529 MG/ML IV SOLN
13.0000 mL | Freq: Once | INTRAVENOUS | Status: AC | PRN
  Administered 2017-10-18: 15 mL via INTRAVENOUS

## 2018-03-07 DIAGNOSIS — R05 Cough: Secondary | ICD-10-CM | POA: Diagnosis not present

## 2018-03-07 DIAGNOSIS — I10 Essential (primary) hypertension: Secondary | ICD-10-CM | POA: Diagnosis not present

## 2018-03-07 DIAGNOSIS — J209 Acute bronchitis, unspecified: Secondary | ICD-10-CM | POA: Diagnosis not present

## 2018-03-07 DIAGNOSIS — E7800 Pure hypercholesterolemia, unspecified: Secondary | ICD-10-CM | POA: Diagnosis not present

## 2018-04-07 DIAGNOSIS — R1084 Generalized abdominal pain: Secondary | ICD-10-CM | POA: Diagnosis not present

## 2018-04-07 DIAGNOSIS — N390 Urinary tract infection, site not specified: Secondary | ICD-10-CM | POA: Diagnosis not present

## 2018-04-07 DIAGNOSIS — K625 Hemorrhage of anus and rectum: Secondary | ICD-10-CM | POA: Diagnosis not present

## 2018-04-07 DIAGNOSIS — K644 Residual hemorrhoidal skin tags: Secondary | ICD-10-CM | POA: Diagnosis not present

## 2018-04-07 DIAGNOSIS — K219 Gastro-esophageal reflux disease without esophagitis: Secondary | ICD-10-CM | POA: Diagnosis not present

## 2018-04-10 DIAGNOSIS — K625 Hemorrhage of anus and rectum: Secondary | ICD-10-CM | POA: Diagnosis not present

## 2018-04-10 DIAGNOSIS — K644 Residual hemorrhoidal skin tags: Secondary | ICD-10-CM | POA: Diagnosis not present

## 2018-04-10 DIAGNOSIS — I10 Essential (primary) hypertension: Secondary | ICD-10-CM | POA: Diagnosis not present

## 2018-06-30 DIAGNOSIS — Z6824 Body mass index (BMI) 24.0-24.9, adult: Secondary | ICD-10-CM | POA: Diagnosis not present

## 2018-06-30 DIAGNOSIS — R300 Dysuria: Secondary | ICD-10-CM | POA: Diagnosis not present

## 2018-06-30 DIAGNOSIS — I10 Essential (primary) hypertension: Secondary | ICD-10-CM | POA: Diagnosis not present

## 2018-06-30 DIAGNOSIS — N390 Urinary tract infection, site not specified: Secondary | ICD-10-CM | POA: Diagnosis not present

## 2018-07-14 DIAGNOSIS — N390 Urinary tract infection, site not specified: Secondary | ICD-10-CM | POA: Diagnosis not present

## 2018-07-14 DIAGNOSIS — R300 Dysuria: Secondary | ICD-10-CM | POA: Diagnosis not present

## 2018-07-16 ENCOUNTER — Encounter: Payer: Self-pay | Admitting: Cardiology

## 2018-07-16 ENCOUNTER — Other Ambulatory Visit: Payer: Self-pay

## 2018-07-16 ENCOUNTER — Ambulatory Visit: Payer: Medicare HMO | Admitting: Cardiology

## 2018-07-16 VITALS — BP 191/95 | HR 111 | Temp 98.2°F | Ht 65.0 in | Wt 142.0 lb

## 2018-07-16 DIAGNOSIS — I10 Essential (primary) hypertension: Secondary | ICD-10-CM

## 2018-07-16 DIAGNOSIS — I493 Ventricular premature depolarization: Secondary | ICD-10-CM

## 2018-07-16 DIAGNOSIS — R69 Illness, unspecified: Secondary | ICD-10-CM | POA: Diagnosis not present

## 2018-07-16 DIAGNOSIS — F411 Generalized anxiety disorder: Secondary | ICD-10-CM

## 2018-07-16 MED ORDER — AMLODIPINE BESY-BENAZEPRIL HCL 5-20 MG PO CAPS: 1.0000 | ORAL_CAPSULE | Freq: Every evening | ORAL | 2 refills | Status: DC

## 2018-07-16 MED ORDER — PROPRANOLOL HCL ER 80 MG PO CP24: 80.0000 mg | ORAL_CAPSULE | ORAL | 2 refills | Status: DC

## 2018-07-16 NOTE — Progress Notes (Signed)
Primary Physician/Referring:  Janie Morning, DO  Patient ID: Emily Booth, female    DOB: [birth year], 73 y.o.   MRN: [id number]  Chief Complaint  Patient presents with  . Hypertension    pt c/o chest pain or GERD? Irregular heartbeat?   . New Patient (Initial Visit)  . Chest Pain    HPI: Emily Booth  is a 73 y.o. female  with Hypertension, well controlled on lisinopril until 2-3 months ago, severe anxiety, self-referred herself for evaluation of uncontrolled hypertension for the past one to 2 months.  States that especially in the last 2-3 weeks blood pressure being very difficult to control and she has been using within 4-5  10 mg lisinopril tablets a day.  She denies chest pain, palpitations, dizziness or syncope.  States that she remains active.   Past Medical History:  Diagnosis Date  . Anxiety   . Hypertension     Past Surgical History:  Procedure Laterality Date  . ANTERIOR AND POSTERIOR REPAIR N/A 09/06/2017   Procedure: ANTERIOR (CYSTOCELE) AND POSTERIOR REPAIR (RECTOCELE);  Surgeon: Cheri Fowler, MD;  Location: [facility];  Service: Gynecology;  Laterality: N/A;  . cataract surgery      bilateral   . CYSTOSCOPY N/A 09/06/2017   Procedure: CYSTOSCOPY;  Surgeon: Cheri Fowler, MD;  Location: [facility];  Service: Gynecology;  Laterality: N/A;  . VAGINAL HYSTERECTOMY N/A 09/06/2017   Procedure: HYSTERECTOMY VAGINAL;  Surgeon: Cheri Fowler, MD;  Location: [facility];  Service: Gynecology;  Laterality: N/A;  . WRIST SURGERY      Social History   Socioeconomic History  . Marital status: Single    Spouse name: Not on file  . Number of children: Not on file  . Years of education: Not on file  . Highest education level: Not on file  Occupational History  . Not on file  Social Needs  . Financial resource strain: Not on file  . Food insecurity:    Worry: Not on file    Inability: Not on file  .  Transportation needs:    Medical: Not on file    Non-medical: Not on file  Tobacco Use  . Smoking status: Never Smoker  . Smokeless tobacco: Never Used  Substance and Sexual Activity  . Alcohol use: Yes    Comment: Rarely  . Drug use: Never  . Sexual activity: Not on file  Lifestyle  . Physical activity:    Days per week: Not on file    Minutes per session: Not on file  . Stress: Not on file  Relationships  . Social connections:    Talks on phone: Not on file    Gets together: Not on file    Attends religious service: Not on file    Active member of club or organization: Not on file    Attends meetings of clubs or organizations: Not on file    Relationship status: Not on file  . Intimate partner violence:    Fear of current or ex partner: Not on file    Emotionally abused: Not on file    Physically abused: Not on file    Forced sexual activity: Not on file  Other Topics Concern  . Not on file  Social History Narrative  . Not on file    Review of Systems  Constitution: Negative for chills, decreased appetite, malaise/fatigue and weight gain.  Cardiovascular: Negative for chest pain, dyspnea on exertion, leg swelling and  syncope.  Endocrine: Negative for cold intolerance.  Hematologic/Lymphatic: Does not bruise/bleed easily.  Musculoskeletal: Negative for joint swelling.  Gastrointestinal: Negative for abdominal pain, anorexia, change in bowel habit, hematochezia and melena.  Genitourinary: Positive for frequency (frequent UTI recently).  Neurological: Negative for headaches and light-headedness.  Psychiatric/Behavioral: Negative for depression and substance abuse.  All other systems reviewed and are negative.     Objective  Blood pressure (!) 191/95, pulse (!) 111, temperature 98.2 F (36.8 C), height '5\' 5"'  (1.651 m), weight 142 lb (64.4 kg), SpO2 97 %. Body mass index is 23.63 kg/m.    Physical Exam  Constitutional: She appears well-developed and well-nourished.  No distress.  HENT:  Head: Atraumatic.  Eyes: Conjunctivae are normal.  Neck: Neck supple. No JVD present. No thyromegaly present.  Cardiovascular: Normal rate, regular rhythm, normal heart sounds and intact distal pulses. Exam reveals no gallop.  No murmur heard. Pulmonary/Chest: Effort normal and breath sounds normal.  Abdominal: Soft. Bowel sounds are normal.  Musculoskeletal: Normal range of motion.  Neurological: She is alert.  Skin: Skin is warm and dry.  Psychiatric: She has a normal mood and affect.   Radiology: No results found.  Laboratory examination:    CMP Latest Ref Rng & Units 08/27/2017  Glucose 70 - 99 mg/dL 103(H)  BUN 8 - 23 mg/dL 17  Creatinine 0.44 - 1.00 mg/dL 0.58  Sodium 135 - 145 mmol/L 143  Potassium 3.5 - 5.1 mmol/L 3.8  Chloride 98 - 111 mmol/L 112(H)  CO2 22 - 32 mmol/L 25  Calcium 8.9 - 10.3 mg/dL 10.0   CBC Latest Ref Rng & Units 09/07/2017 08/27/2017  WBC 4.0 - 10.5 K/uL 14.8(H) 7.1  Hemoglobin 12.0 - 15.0 g/dL 12.5 14.1  Hematocrit 36.0 - 46.0 % 36.8 41.5  Platelets 150 - 400 K/uL 171 200   PRN Meds:. Medications Discontinued During This Encounter  Medication Reason  . lisinopril (PRINIVIL,ZESTRIL) 20 MG tablet Error  . ibuprofen (ADVIL,MOTRIN) 600 MG tablet Error  . lisinopril (ZESTRIL) 10 MG tablet Change in therapy   Current Meds  Medication Sig  . acetaminophen (TYLENOL) 500 MG tablet Take 500 mg by mouth every 6 (six) hours as needed.  . diazepam (VALIUM) 2 MG tablet Take 2 mg by mouth at bedtime.  . Multiple Vitamin (MULTIVITAMIN) tablet Take 1 tablet by mouth daily.  Marland Kitchen omeprazole (PRILOSEC) 40 MG capsule Take 40 mg by mouth daily.  . [DISCONTINUED] lisinopril (ZESTRIL) 10 MG tablet Take 10 mg by mouth daily.    Cardiac Studies:   None  Assessment   Essential hypertension - Plan: EKG 12-Lead, amLODipine-benazepril (LOTREL) 5-20 MG capsule, CMP14+EGFR, Lipid Panel With LDL/HDL Ratio, CANCELED: EKG 12-Lead  Frequent PVCs -  Plan: propranolol ER (INDERAL LA) 80 MG 24 hr capsule, PCV ECHOCARDIOGRAM COMPLETE, TSH  Generalized anxiety disorder  EKG 07/16/2018: Normal sinus rhythm/sinus tachycardia at rate of 110 bpm, left atrial abnormality, no evidence of ischemia, frequent PVCs.(4)  Recommendations:   Patient who I had seen several years ago, when appointment herself as her blood pressure was the past one to 2 months have been uncontrolled.  Physical examination is unremarkable, no abdominal bruit, no S3 gallop, she does have frequent PVCs on EKG. Will schedule for an echocardiogram. Doubt FMD.  I discontinued lisinopril and switched her to benazepril/amlodipine 20/5 mg daily and also due to marked anxiety that she has, and hypertension, I started her on Inderal LA 80 mg daily. I would like her to get labs  in about one week to 10 days. I'll see her back in the office in 4-6 weeks for follow-up.  Depending upon echo, blood pressure response and labs I will consider further cardiac workup.  Adrian Prows, MD, [facility] 07/16/2018, 12:22 PM [facility]. [place] Pager: [phone] Office: [phone] If no answer Cell [phone]

## 2018-07-17 ENCOUNTER — Telehealth: Payer: Self-pay

## 2018-07-17 NOTE — Telephone Encounter (Signed)
Did you mean to send me a message

## 2018-07-24 ENCOUNTER — Ambulatory Visit: Payer: Self-pay | Admitting: Cardiology

## 2018-08-01 DIAGNOSIS — H353121 Nonexudative age-related macular degeneration, left eye, early dry stage: Secondary | ICD-10-CM | POA: Diagnosis not present

## 2018-08-01 DIAGNOSIS — H35051 Retinal neovascularization, unspecified, right eye: Secondary | ICD-10-CM | POA: Diagnosis not present

## 2018-08-01 DIAGNOSIS — H353114 Nonexudative age-related macular degeneration, right eye, advanced atrophic with subfoveal involvement: Secondary | ICD-10-CM | POA: Diagnosis not present

## 2018-08-01 DIAGNOSIS — H353212 Exudative age-related macular degeneration, right eye, with inactive choroidal neovascularization: Secondary | ICD-10-CM | POA: Diagnosis not present

## 2018-08-13 DIAGNOSIS — N8111 Cystocele, midline: Secondary | ICD-10-CM | POA: Diagnosis not present

## 2018-08-14 DIAGNOSIS — N8111 Cystocele, midline: Secondary | ICD-10-CM | POA: Diagnosis not present

## 2018-08-15 DIAGNOSIS — I10 Essential (primary) hypertension: Secondary | ICD-10-CM | POA: Diagnosis not present

## 2018-08-15 DIAGNOSIS — I493 Ventricular premature depolarization: Secondary | ICD-10-CM | POA: Diagnosis not present

## 2018-08-16 LAB — LIPID PANEL WITH LDL/HDL RATIO
Cholesterol, Total: 212 mg/dL — ABNORMAL HIGH (ref 100–199)
HDL: 41 mg/dL (ref >39)
LDL Calculated: 141 mg/dL — ABNORMAL HIGH (ref 0–99)
LDl/HDL Ratio: 3.4 ratio — ABNORMAL HIGH (ref 0.0–3.2)
Triglycerides: 151 mg/dL — ABNORMAL HIGH (ref 0–149)
VLDL Cholesterol Cal: 30 mg/dL (ref 5–40)

## 2018-08-16 LAB — CMP14+EGFR
ALT: 13 IU/L (ref 0–32)
AST: 14 IU/L (ref 0–40)
Albumin/Globulin Ratio: 2.1 (ref 1.2–2.2)
Albumin: 4.7 g/dL (ref 3.7–4.7)
Alkaline Phosphatase: 67 IU/L (ref 39–117)
BUN/Creatinine Ratio: 26 (ref 12–28)
BUN: 18 mg/dL (ref 8–27)
Bilirubin Total: 0.8 mg/dL (ref 0.0–1.2)
CO2: 22 mmol/L (ref 20–29)
Calcium: 10.5 mg/dL — ABNORMAL HIGH (ref 8.7–10.3)
Chloride: 103 mmol/L (ref 96–106)
Creatinine, Ser: 0.68 mg/dL (ref 0.57–1.00)
GFR calc Af Amer: 100 mL/min/{1.73_m2} (ref >59)
GFR calc non Af Amer: 87 mL/min/{1.73_m2} (ref >59)
Globulin, Total: 2.2 g/dL (ref 1.5–4.5)
Glucose: 115 mg/dL — ABNORMAL HIGH (ref 65–99)
Potassium: 4 mmol/L (ref 3.5–5.2)
Sodium: 143 mmol/L (ref 134–144)
Total Protein: 6.9 g/dL (ref 6.0–8.5)

## 2018-08-16 LAB — TSH: TSH: 1.24 u[IU]/mL (ref 0.450–4.500)

## 2018-08-20 NOTE — Progress Notes (Signed)
Please let patient know that kidney function, electrolytes, and liver enzymes all within normal limits. Glucose was mildly elevated. TSH within normal limits. Cholesterol levels were elevated. She may benefit from at least low dose statin as well as diet changes. Will be further discussed at her follow up appt with Dr. Einar Gip. Please ensure that she has follow up scheduled.

## 2018-08-20 NOTE — Progress Notes (Signed)
Left detailed vm on machine  

## 2018-08-21 ENCOUNTER — Other Ambulatory Visit: Payer: Self-pay

## 2018-08-21 ENCOUNTER — Ambulatory Visit (INDEPENDENT_AMBULATORY_CARE_PROVIDER_SITE_OTHER): Payer: Medicare HMO

## 2018-08-21 DIAGNOSIS — I493 Ventricular premature depolarization: Secondary | ICD-10-CM | POA: Diagnosis not present

## 2018-08-21 DIAGNOSIS — I10 Essential (primary) hypertension: Secondary | ICD-10-CM | POA: Diagnosis not present

## 2018-08-28 ENCOUNTER — Encounter: Payer: Self-pay | Admitting: Cardiology

## 2018-08-29 ENCOUNTER — Other Ambulatory Visit: Payer: Self-pay

## 2018-08-29 ENCOUNTER — Encounter: Payer: Self-pay | Admitting: Cardiology

## 2018-08-29 ENCOUNTER — Ambulatory Visit (INDEPENDENT_AMBULATORY_CARE_PROVIDER_SITE_OTHER): Payer: Medicare HMO | Admitting: Cardiology

## 2018-08-29 VITALS — BP 133/65 | HR 70 | Ht 65.0 in | Wt 145.0 lb

## 2018-08-29 DIAGNOSIS — R0609 Other forms of dyspnea: Secondary | ICD-10-CM

## 2018-08-29 DIAGNOSIS — E782 Mixed hyperlipidemia: Secondary | ICD-10-CM | POA: Diagnosis not present

## 2018-08-29 DIAGNOSIS — F411 Generalized anxiety disorder: Secondary | ICD-10-CM | POA: Diagnosis not present

## 2018-08-29 DIAGNOSIS — R69 Illness, unspecified: Secondary | ICD-10-CM | POA: Diagnosis not present

## 2018-08-29 DIAGNOSIS — I10 Essential (primary) hypertension: Secondary | ICD-10-CM

## 2018-08-29 DIAGNOSIS — R739 Hyperglycemia, unspecified: Secondary | ICD-10-CM | POA: Diagnosis not present

## 2018-08-29 DIAGNOSIS — R0600 Dyspnea, unspecified: Secondary | ICD-10-CM

## 2018-08-29 DIAGNOSIS — I493 Ventricular premature depolarization: Secondary | ICD-10-CM

## 2018-08-29 IMAGING — CT CT ANGIO CHEST
2 of 6 series · 18 of 46 positions shown · IV contrast (APPLIED)
Comparison: None.

CLINICAL DATA: Tachycardia, anxiety for 2 days. Hysterectomy 2
weeks ago. History of hypertension.

EXAM:
CT ANGIOGRAPHY CHEST WITH CONTRAST
TECHNIQUE: Multidetector CT imaging of the chest was performed using the
standard protocol during bolus administration of intravenous
contrast. Multiplanar CT image reconstructions and MIPs were
obtained to evaluate the vascular anatomy.
CONTRAST:  100mL H3T8DM-C7Y IOPAMIDOL (H3T8DM-C7Y) INJECTION 76%

[Series 11: thins · axial · 0.53mm/px · z∈[-107,+155]mm · 16 of 288 slices shown]
[im 13/288  lung]
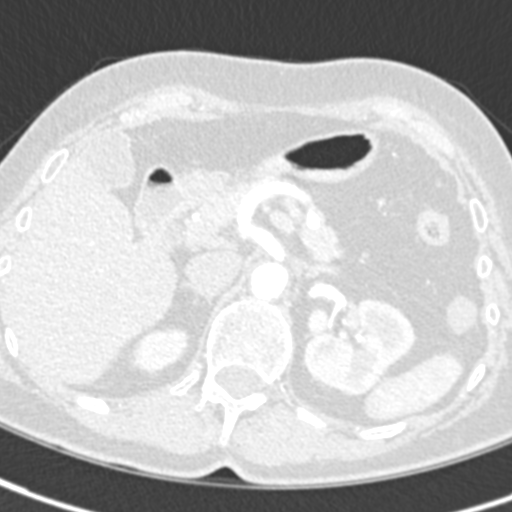
[im 38/288  soft-tissue]
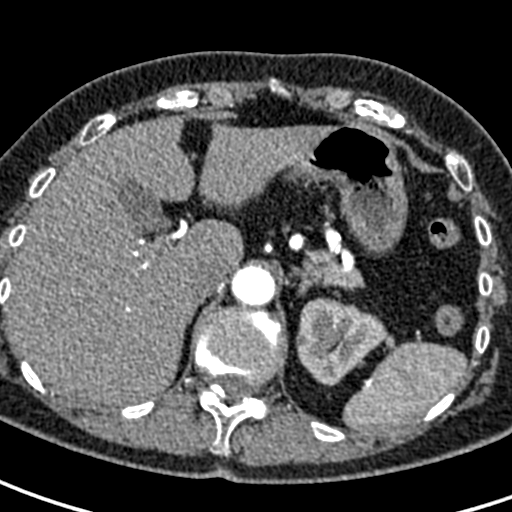
[im 50/288  lung]
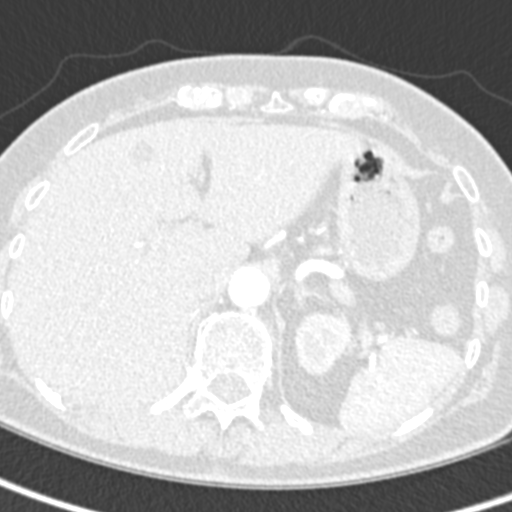
[im 63/288  soft-tissue]
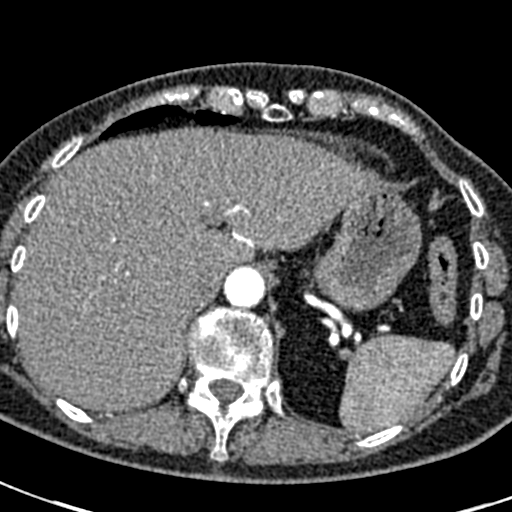
[im 88/288  lung]
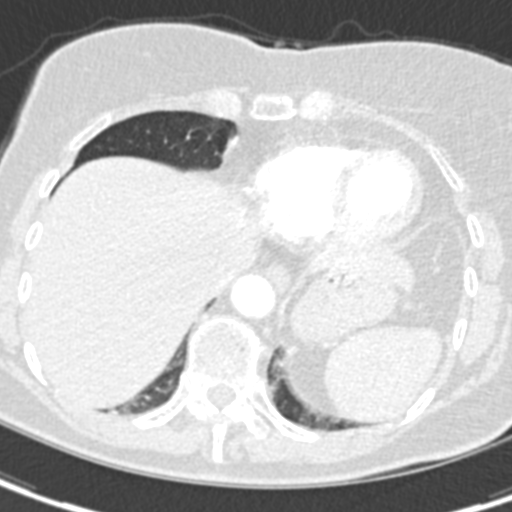
[im 100/288  soft-tissue]
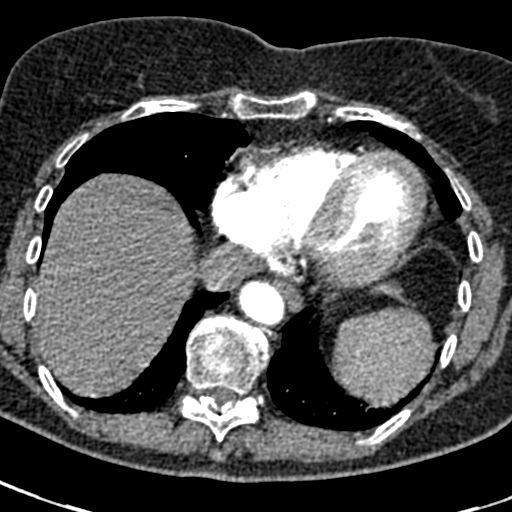
[im 113/288  lung]
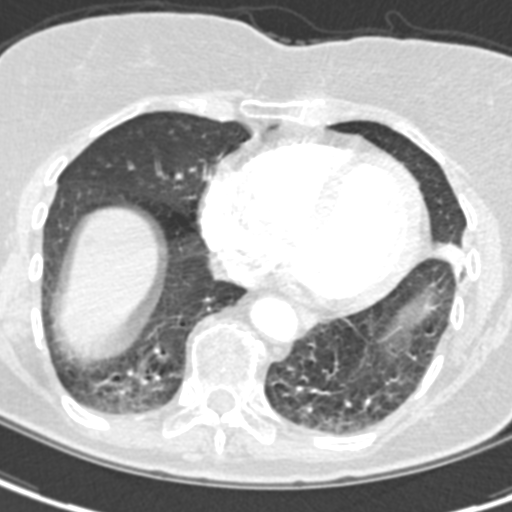
[im 138/288  soft-tissue]
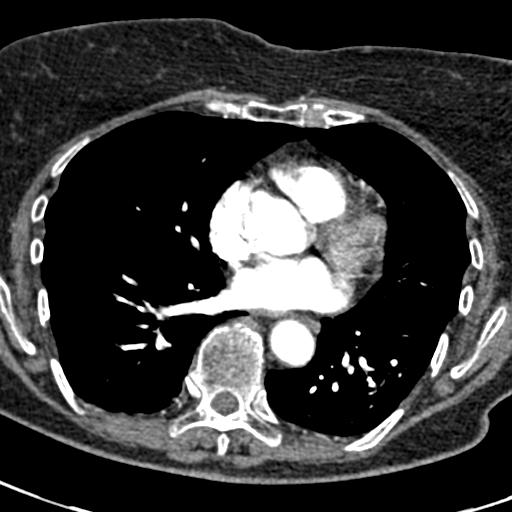
[im 150/288  lung]
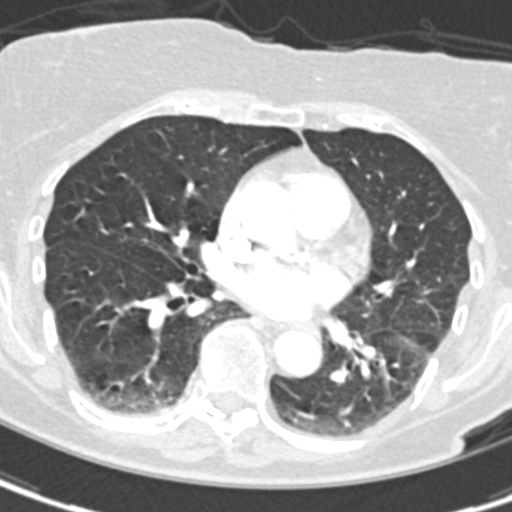
[im 175/288  soft-tissue]
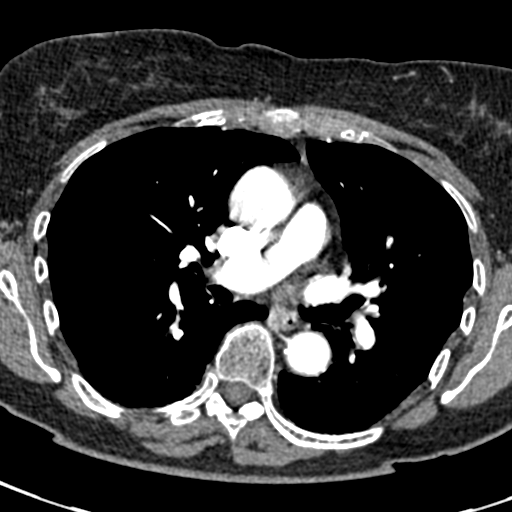
[im 188/288  lung]
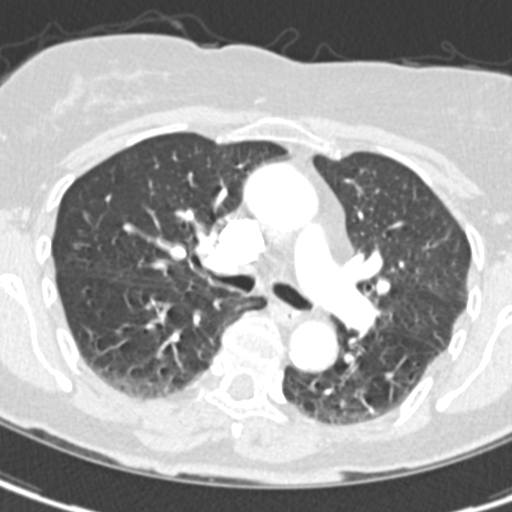
[im 200/288  soft-tissue]
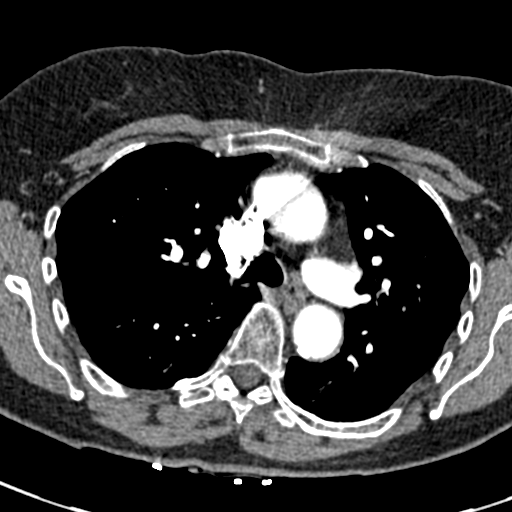
[im 225/288  lung]
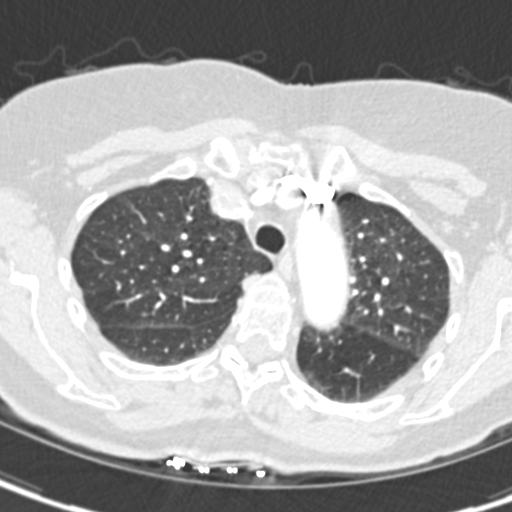
[im 238/288  soft-tissue]
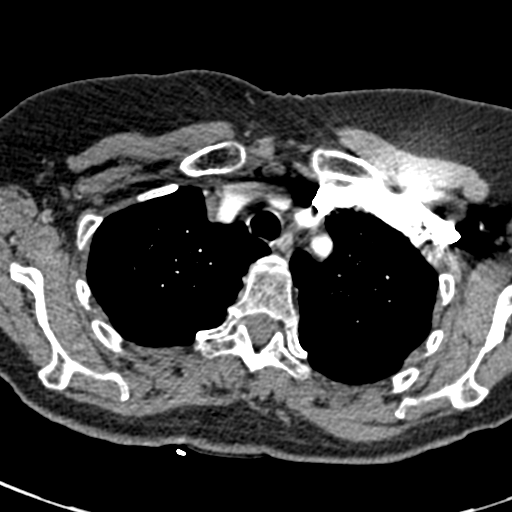
[im 250/288  lung]
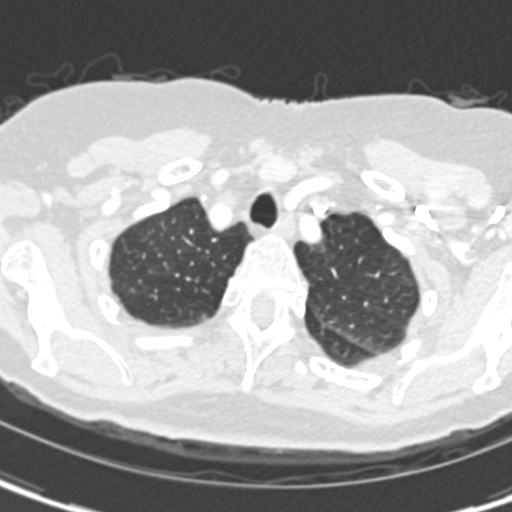
[im 275/288  soft-tissue]
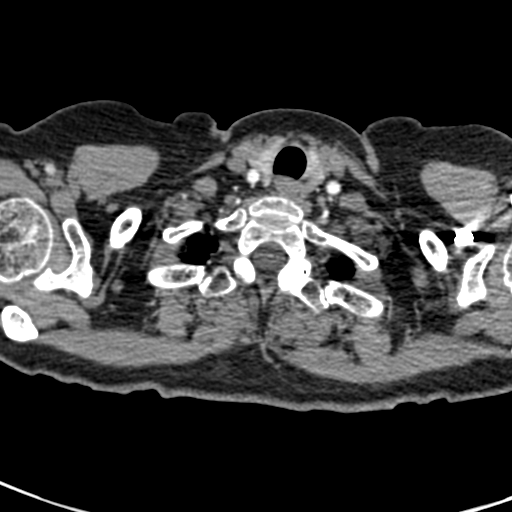

[Series 13: coronal mpr · coronal · 0.58mm/px · 2 of 80 slices shown]
[im 27/80  soft-tissue]
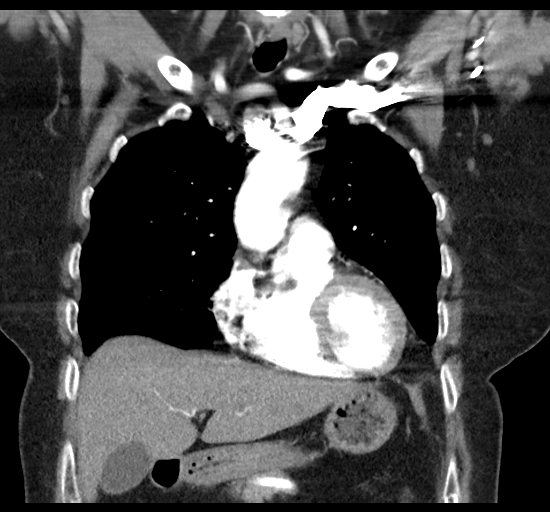
[im 53/80  soft-tissue]
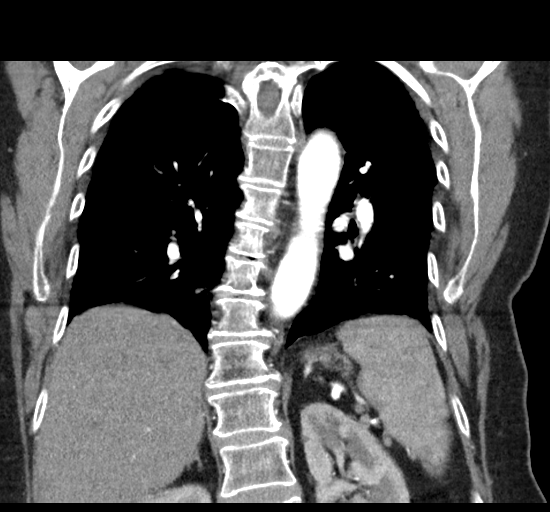

[18 of 46 positions shown; findings below may reference images not displayed]

FINDINGS: CARDIOVASCULAR: Adequate contrast opacification of the pulmonary
artery's. Main pulmonary artery is not enlarged. No pulmonary
arterial filling defects to the level of the. Heterogeneous
subsegmental branches due to respiratory motion. Heart size is
normal, no right heart strain. No pericardial effusion. Thoracic
aorta is normal course and caliber, mild calcific atherosclerosis.

MEDIASTINUM/NODES: No lymphadenopathy by CT size criteria.

LUNGS/PLEURA: Tracheobronchial tree is patent, no pneumothorax. No
pleural effusions, focal consolidations, pulmonary nodules or
masses. Dependent atelectasis.

UPPER ABDOMEN: Nonacute. 21 mm hypodensity pancreatic head (series
10, image 95 and series 13, image 37). 16 mm simple cyst segment 4
of the liver.

MUSCULOSKELETAL: Nonacute. Broad dextroscoliosis. Mild degenerative
change of the thoracic spine.

Review of the MIP images confirms the above findings.
IMPRESSION: 1. No acute pulmonary embolism or acute cardiopulmonary process.
2. **An incidental finding of potential clinical significance has
been found. 21 mm suspected pancreatic head mass, incompletely
evaluated. Recommend MRI of the pancreas with contrast.**
3. These results will be called to the ordering clinician or
representative by the Radiologist Assistant, and communication
documented in the PACS or zVision Dashboard.

Aortic Atherosclerosis (CN07X-R5L.L).

## 2018-08-29 MED ORDER — SIMVASTATIN 40 MG PO TABS: 20.0000 mg | ORAL_TABLET | Freq: Every day | ORAL | 2 refills | Status: DC

## 2018-08-29 NOTE — Progress Notes (Signed)
Virtual Visit via Telephone Note: Patient unable to use video assisted device.  This visit type was conducted due to national recommendations for restrictions regarding the COVID-19 Pandemic (e.g. social distancing).  This format is felt to be most appropriate for this patient at this time.  All issues noted in this document were discussed and addressed.  No physical exam was performed.  The patient has consented to conduct a Telehealth visit and understands insurance will be billed.   I connected with@, on 08/29/18 at  by TELEPHONE and verified that I am speaking with the correct person using two identifiers.   I discussed the limitations of evaluation and management by telemedicine and the availability of in person appointments. The patient expressed understanding and agreed to proceed.   I have discussed with patient regarding the safety during COVID Pandemic and steps and precautions to be taken including social distancing, frequent hand wash and use of detergent soap, gels with the patient. I asked the patient to avoid touching mouth, nose, eyes, ears with the hands. I encouraged regular walking around the neighborhood and exercise and regular diet, as long as social distancing can be maintained.  Primary Physician/Referring:  Janie Morning, DO  Patient ID: Emily Booth, female    DOB: 1945-08-13, 73 y.o.   MRN: [id number]  Chief Complaint  Patient presents with  . PVC  . Results  . Follow-up    HPI: Emily Booth  is a 73 y.o. female  with Hypertension, well controlled on lisinopril until 2-3 months ago, severe anxiety, self-referred herself for evaluation of uncontrolled hypertension. Since last OV and starting Benazepril/Amlodipine, BP has been under excellent control. Feels no palpitations, but does notice fatigue, Mildly decreased excess tolerance and probable dyspnea and states she is depressed due to COVID-19.  She was also started on Propranolol for anxiety as well and  this has improved with medication.   Past Medical History:  Diagnosis Date  . Anxiety   . Hypertension     Past Surgical History:  Procedure Laterality Date  . ANTERIOR AND POSTERIOR REPAIR N/A 09/06/2017   Procedure: ANTERIOR (CYSTOCELE) AND POSTERIOR REPAIR (RECTOCELE);  Surgeon: Cheri Fowler, MD;  Location: [facility];  Service: Gynecology;  Laterality: N/A;  . cataract surgery      bilateral   . CYSTOSCOPY N/A 09/06/2017   Procedure: CYSTOSCOPY;  Surgeon: Cheri Fowler, MD;  Location: [facility];  Service: Gynecology;  Laterality: N/A;  . VAGINAL HYSTERECTOMY N/A 09/06/2017   Procedure: HYSTERECTOMY VAGINAL;  Surgeon: Cheri Fowler, MD;  Location: [place];  Service: Gynecology;  Laterality: N/A;  . WRIST SURGERY      Social History   Socioeconomic History  . Marital status: Single    Spouse name: Not on file  . Number of children: 3  . Years of education: Not on file  . Highest education level: Not on file  Occupational History  . Not on file  Social Needs  . Financial resource strain: Not on file  . Food insecurity    Worry: Not on file    Inability: Not on file  . Transportation needs    Medical: Not on file    Non-medical: Not on file  Tobacco Use  . Smoking status: Never Smoker  . Smokeless tobacco: Never Used  Substance and Sexual Activity  . Alcohol use: Yes    Comment: Rarely  . Drug use: Never  . Sexual activity: Not on file  Lifestyle  .  Physical activity    Days per week: Not on file    Minutes per session: Not on file  . Stress: Not on file  Relationships  . Social Herbalist on phone: Not on file    Gets together: Not on file    Attends religious service: Not on file    Active member of club or organization: Not on file    Attends meetings of clubs or organizations: Not on file    Relationship status: Not on file  . Intimate partner violence    Fear of current or ex partner:  Not on file    Emotionally abused: Not on file    Physically abused: Not on file    Forced sexual activity: Not on file  Other Topics Concern  . Not on file  Social History Narrative  . Not on file   Review of Systems  Constitution: Positive for malaise/fatigue. Negative for chills, decreased appetite and weight gain.  Cardiovascular: Positive for dyspnea on exertion. Negative for chest pain, leg swelling, orthopnea, palpitations and syncope.  Endocrine: Negative for cold intolerance.  Hematologic/Lymphatic: Does not bruise/bleed easily.  Musculoskeletal: Negative for joint swelling.  Gastrointestinal: Negative for abdominal pain, anorexia, change in bowel habit, hematochezia and melena.  Genitourinary: Positive for frequency (frequent UTI recently).  Neurological: Negative for headaches and light-headedness.  Psychiatric/Behavioral: Negative for depression and substance abuse.  All other systems reviewed and are negative.     Objective  Blood pressure 133/65, pulse 70, height 5' 5" (1.651 m), weight 145 lb (65.8 kg). Body mass index is 24.13 kg/m.   Physical exam not performed or limited due to virtual visit.   Please see exam details from prior visit is as below.  Physical Exam  Constitutional: She appears well-developed and well-nourished. No distress.  HENT:  Head: Atraumatic.  Eyes: Conjunctivae are normal.  Neck: Neck supple. No JVD present. No thyromegaly present.  Cardiovascular: Normal rate, regular rhythm, normal heart sounds and intact distal pulses. Exam reveals no gallop.  No murmur heard. Pulmonary/Chest: Effort normal and breath sounds normal.  Abdominal: Soft. Bowel sounds are normal.  Musculoskeletal: Normal range of motion.  Neurological: She is alert.  Skin: Skin is warm and dry.  Psychiatric: She has a normal mood and affect.   Radiology: No results found.  Laboratory examination:    CMP Latest Ref Rng & Units 08/15/2018 08/27/2017  Glucose 65 - 99  mg/dL 115(H) 103(H)  BUN 8 - 27 mg/dL 18 17  Creatinine 0.57 - 1.00 mg/dL 0.68 0.58  Sodium 134 - 144 mmol/L 143 143  Potassium 3.5 - 5.2 mmol/L 4.0 3.8  Chloride 96 - 106 mmol/L 103 112(H)  CO2 20 - 29 mmol/L 22 25  Calcium 8.7 - 10.3 mg/dL 10.5(H) 10.0  Total Protein 6.0 - 8.5 g/dL 6.9 -  Total Bilirubin 0.0 - 1.2 mg/dL 0.8 -  Alkaline Phos 39 - 117 IU/L 67 -  AST 0 - 40 IU/L 14 -  ALT 0 - 32 IU/L 13 -   CBC Latest Ref Rng & Units 09/07/2017 08/27/2017  WBC 4.0 - 10.5 K/uL 14.8(H) 7.1  Hemoglobin 12.0 - 15.0 g/dL 12.5 14.1  Hematocrit 36.0 - 46.0 % 36.8 41.5  Platelets 150 - 400 K/uL 171 200   Lipid Panel     Component Value Date/Time   CHOL 212 (H) 08/15/2018 0839   TRIG 151 (H) 08/15/2018 0839   HDL 41 08/15/2018 0839   LDLCALC 141 (H)  08/15/2018 0839    PRN Meds:. There are no discontinued medications. Current Meds  Medication Sig  . acetaminophen (TYLENOL) 500 MG tablet Take 500 mg by mouth every 6 (six) hours as needed.  Marland Kitchen amLODipine-benazepril (LOTREL) 5-20 MG capsule Take 1 capsule by mouth every evening.  . diazepam (VALIUM) 2 MG tablet Take 2 mg by mouth at bedtime.  . Multiple Vitamin (MULTIVITAMIN) tablet Take 1 tablet by mouth daily.  Marland Kitchen omeprazole (PRILOSEC) 40 MG capsule Take 40 mg by mouth daily.  . propranolol ER (INDERAL LA) 80 MG 24 hr capsule Take 1 capsule (80 mg total) by mouth every morning.    Cardiac Studies:   Echocardiogram 08/21/2018: Normal LV systolic function with EF 55%. Left ventricle cavity is normal in size. Normal global wall motion. Doppler evidence of grade I (impaired) diastolic dysfunction, normal LAP.  Left atrial cavity is normal in size. Aneurysmal interatrial septum without 2D or color Doppler evidence of interatrial shunt. Moderate (Grade II) mitral regurgitation. Mild tricuspid regurgitation. Estimated pulmonary artery systolic pressure is 25 mmHg.  Assessment     ICD-10-CM   1. Essential hypertension  I10 CMP14+EGFR  2.  Frequent PVCs  I49.3 PCV MYOCARDIAL PERFUSION WITH LEXISCAN  3. Dyspnea on exertion  R06.09 PCV MYOCARDIAL PERFUSION WITH LEXISCAN  4. Generalized anxiety disorder  F41.1   5. Mixed hyperlipidemia  E78.2 simvastatin (ZOCOR) 40 MG tablet    Lipid Panel With LDL/HDL Ratio  6. Hyperglycemia  R73.9    EKG 07/16/2018: Normal sinus rhythm/sinus tachycardia at rate of 110 bpm, left atrial abnormality, no evidence of ischemia, frequent PVCs.(4)  Recommendations:   Patient presents for follow-up of hypertension, frequent PVCs noted on the EKG, underwent echocardiogram.  Since being on amlodipine/benazepril and also propranolol, blood pressure is improved and also her anxiety has improved significantly.  I reviewed her labs, lipids show mixed hyperlipidemia and she does have hyperglycemia.  Previously I had recommended observation only for her dyspnea and also frequent PVCs but in view of abnormal risk factors, I will perform a Lexiscan Myoview stress test, inability to exercise due to Covid 19 and also patient on a beta blocker.  I'll start her on simvastatin 40 mg in the evening, we'll obtain lipid profile along with CMP in 6 weeks and see her back after the test.  Continue present medications. I have discussed with her regarding low, heart attack.  Adrian Prows, MD, [facility] 08/29/2018, 9:06 AM [facility]. [place] Pager: [phone] Office: [phone] If no answer Cell [phone]

## 2018-10-12 ENCOUNTER — Other Ambulatory Visit: Payer: Self-pay | Admitting: Cardiology

## 2018-10-12 DIAGNOSIS — I10 Essential (primary) hypertension: Secondary | ICD-10-CM

## 2018-10-12 DIAGNOSIS — I493 Ventricular premature depolarization: Secondary | ICD-10-CM

## 2018-10-14 ENCOUNTER — Telehealth: Payer: Self-pay

## 2018-10-14 DIAGNOSIS — I493 Ventricular premature depolarization: Secondary | ICD-10-CM

## 2018-10-14 DIAGNOSIS — I10 Essential (primary) hypertension: Secondary | ICD-10-CM

## 2018-10-14 MED ORDER — AMLODIPINE BESY-BENAZEPRIL HCL 5-20 MG PO CAPS: ORAL_CAPSULE | ORAL | 1 refills | Status: DC

## 2018-10-14 MED ORDER — PROPRANOLOL HCL ER 80 MG PO CP24: ORAL_CAPSULE | ORAL | 1 refills | Status: DC

## 2018-10-14 NOTE — Telephone Encounter (Signed)
She can stop simva, lets see how she does. She had tolerated other drugs well including lotrel and inderal LA

## 2018-10-14 NOTE — Telephone Encounter (Signed)
error 

## 2018-10-14 NOTE — Telephone Encounter (Signed)
Pt called and said that she stopped her Simvastatin because it causes her terrible body ache, she also mentioned that she is extremely tired. Her BP has been in the 120/56 range. Please advise.

## 2018-10-15 NOTE — Telephone Encounter (Signed)
S/w pt advised ok to stop Statin.

## 2018-10-24 DIAGNOSIS — I10 Essential (primary) hypertension: Secondary | ICD-10-CM | POA: Diagnosis not present

## 2018-10-24 DIAGNOSIS — E782 Mixed hyperlipidemia: Secondary | ICD-10-CM | POA: Diagnosis not present

## 2018-10-25 LAB — CMP14+EGFR
ALT: 13 IU/L (ref 0–32)
AST: 20 IU/L (ref 0–40)
Albumin/Globulin Ratio: 2.2 (ref 1.2–2.2)
Albumin: 4.8 g/dL — ABNORMAL HIGH (ref 3.7–4.7)
Alkaline Phosphatase: 69 IU/L (ref 39–117)
BUN/Creatinine Ratio: 26 (ref 12–28)
BUN: 18 mg/dL (ref 8–27)
Bilirubin Total: 0.6 mg/dL (ref 0.0–1.2)
CO2: 24 mmol/L (ref 20–29)
Calcium: 10.3 mg/dL (ref 8.7–10.3)
Chloride: 106 mmol/L (ref 96–106)
Creatinine, Ser: 0.68 mg/dL (ref 0.57–1.00)
GFR calc Af Amer: 100 mL/min/{1.73_m2} (ref >59)
GFR calc non Af Amer: 87 mL/min/{1.73_m2} (ref >59)
Globulin, Total: 2.2 g/dL (ref 1.5–4.5)
Glucose: 102 mg/dL — ABNORMAL HIGH (ref 65–99)
Potassium: 4.1 mmol/L (ref 3.5–5.2)
Sodium: 142 mmol/L (ref 134–144)
Total Protein: 7 g/dL (ref 6.0–8.5)

## 2018-10-25 LAB — LIPID PANEL WITH LDL/HDL RATIO
Cholesterol, Total: 217 mg/dL — ABNORMAL HIGH (ref 100–199)
HDL: 40 mg/dL (ref >39)
LDL Chol Calc (NIH): 145 mg/dL — ABNORMAL HIGH (ref 0–99)
LDL/HDL Ratio: 3.6 ratio — ABNORMAL HIGH (ref 0.0–3.2)
Triglycerides: 178 mg/dL — ABNORMAL HIGH (ref 0–149)
VLDL Cholesterol Cal: 32 mg/dL (ref 5–40)

## 2018-11-04 ENCOUNTER — Ambulatory Visit: Payer: Medicare HMO | Admitting: Cardiology

## 2018-11-21 ENCOUNTER — Ambulatory Visit (INDEPENDENT_AMBULATORY_CARE_PROVIDER_SITE_OTHER): Payer: Medicare HMO | Admitting: Cardiology

## 2018-11-21 ENCOUNTER — Encounter: Payer: Self-pay | Admitting: Cardiology

## 2018-11-21 ENCOUNTER — Other Ambulatory Visit: Payer: Self-pay

## 2018-11-21 VITALS — BP 195/90 | HR 73 | Temp 97.8°F | Ht 65.0 in | Wt 145.0 lb

## 2018-11-21 DIAGNOSIS — I10 Essential (primary) hypertension: Secondary | ICD-10-CM

## 2018-11-21 DIAGNOSIS — I493 Ventricular premature depolarization: Secondary | ICD-10-CM

## 2018-11-21 DIAGNOSIS — E782 Mixed hyperlipidemia: Secondary | ICD-10-CM

## 2018-11-21 MED ORDER — ROSUVASTATIN CALCIUM 10 MG PO TABS: 10.0000 mg | ORAL_TABLET | Freq: Every day | ORAL | 3 refills | Status: DC

## 2018-11-21 NOTE — Progress Notes (Signed)
Primary Physician/Referring:  Janie Morning, DO  Patient ID: Emily Booth, female    DOB: 07-Oct-1945, 73 y.o.   MRN: [id number]  Chief Complaint  Patient presents with  . PVC  . Shortness of Breath  . Hyperlipidemia  . Follow-up    HPI: Emily Booth  is a 73 y.o. Caucasian female  with Hypertension, severe anxiety, palpitations and atypical chest pain.  States that since having made medication changes including addition of propranolol and adding Benazepril/Amlodipine, BP has been under excellent control at home.  She occasionally notices dizziness in the morning.  She now presents here for a 20-month visit of hyperlipidemia and hypertension.  Since being on above medications, has not had any further palpitations and has not had any chest pain.  She was prescribed simvastatin and underwent lipid profile test and presents for follow-up, could not tolerate simvastatin due to marked fatigue and generalized ache.  Past Medical History:  Diagnosis Date  . Acid reflux   . Anxiety   . Hyperlipidemia   . Hypertension     Past Surgical History:  Procedure Laterality Date  . ANTERIOR AND POSTERIOR REPAIR N/A 09/06/2017   Procedure: ANTERIOR (CYSTOCELE) AND POSTERIOR REPAIR (RECTOCELE);  Surgeon: Cheri Fowler, MD;  Location: [facility];  Service: Gynecology;  Laterality: N/A;  . cataract surgery      bilateral   . CYSTOSCOPY N/A 09/06/2017   Procedure: CYSTOSCOPY;  Surgeon: Cheri Fowler, MD;  Location: [facility];  Service: Gynecology;  Laterality: N/A;  . VAGINAL HYSTERECTOMY N/A 09/06/2017   Procedure: HYSTERECTOMY VAGINAL;  Surgeon: Cheri Fowler, MD;  Location: [place];  Service: Gynecology;  Laterality: N/A;  . WRIST SURGERY      Social History   Socioeconomic History  . Marital status: Single    Spouse name: Not on file  . Number of children: 3  . Years of education: Not on file  . Highest education  level: Not on file  Occupational History  . Not on file  Social Needs  . Financial resource strain: Not on file  . Food insecurity    Worry: Not on file    Inability: Not on file  . Transportation needs    Medical: Not on file    Non-medical: Not on file  Tobacco Use  . Smoking status: Never Smoker  . Smokeless tobacco: Never Used  Substance and Sexual Activity  . Alcohol use: Yes    Comment: Rarely  . Drug use: Never  . Sexual activity: Not on file  Lifestyle  . Physical activity    Days per week: Not on file    Minutes per session: Not on file  . Stress: Not on file  Relationships  . Social Herbalist on phone: Not on file    Gets together: Not on file    Attends religious service: Not on file    Active member of club or organization: Not on file    Attends meetings of clubs or organizations: Not on file    Relationship status: Not on file  . Intimate partner violence    Fear of current or ex partner: Not on file    Emotionally abused: Not on file    Physically abused: Not on file    Forced sexual activity: Not on file  Other Topics Concern  . Not on file  Social History Narrative  . Not on file   Review of Systems  Constitution:  Negative for chills, decreased appetite, malaise/fatigue and weight gain.  Cardiovascular: Negative for chest pain, dyspnea on exertion, leg swelling, orthopnea, palpitations and syncope.  Endocrine: Negative for cold intolerance.  Hematologic/Lymphatic: Does not bruise/bleed easily.  Musculoskeletal: Negative for joint swelling.  Gastrointestinal: Negative for abdominal pain, anorexia, change in bowel habit, hematochezia and melena.  Genitourinary: Positive for frequency (frequent UTI recently).  Neurological: Positive for dizziness (in the morning after taking Propranolol). Negative for headaches and light-headedness.  Psychiatric/Behavioral: Negative for depression and substance abuse. The patient is nervous/anxious.   All  other systems reviewed and are negative.  Objective    Vitals with BMI 11/21/2018 08/29/2018 07/16/2018  Height 5\' 5"  5\' 5"  5\' 5"   Weight 145 lbs 145 lbs 142 lbs  BMI 24.13 24.13 23.63  Systolic 195 133  Diastolic 90 65 95  Pulse 73 70 111    Physical Exam  Constitutional: She appears well-developed and well-nourished. No distress.  HENT:  Head: Atraumatic.  Eyes: Conjunctivae are normal.  Neck: Neck supple. No JVD present. No thyromegaly present.  Cardiovascular: Normal rate, regular rhythm, normal heart sounds and intact distal pulses. Exam reveals no gallop.  No murmur heard. Pulmonary/Chest: Effort normal and breath sounds normal.  Abdominal: Soft. Bowel sounds are normal.  Musculoskeletal: Normal range of motion.  Neurological: She is alert.  Skin: Skin is warm and dry.  Psychiatric:  Anxious   Radiology: No results found.  Laboratory examination:   CMP Latest Ref Rng & Units 10/24/2018 08/15/2018 08/27/2017  Glucose 65 - 99 mg/dL 12/24/2018) 10/16/2018) 08/29/2017)  BUN 8 - 27 mg/dL 18 18 17   Creatinine 0.57 - 1.00 mg/dL 347(Q 259(D 638(V  Sodium 134 - 144 mmol/L 142 143 143  Potassium 3.5 - 5.2 mmol/L 4.1 4.0 3.8  Chloride 96 - 106 mmol/L 106 103 112(H)  CO2 20 - 29 mmol/L 24 22 25   Calcium 8.7 - 10.3 mg/dL 10.5(H) 10.0  Total Protein 6.0 - 8.5 g/dL 7.0 6.9 -  Total Bilirubin 0.0 - 1.2 mg/dL 0.6 0.8 -  Alkaline Phos 39 - 117 IU/L 69 67 -  AST 0 - 40 IU/L 20 14 -  ALT 0 - 32 IU/L 13 13 -   CBC Latest Ref Rng & Units 09/07/2017 08/27/2017  WBC 4.0 - 10.5 K/uL 14.8(H) 7.1  Hemoglobin 12.0 - 15.0 g/dL 9.51  Hematocrit 88.4 - 46.0 % 36.8 41.5  Platelets 150 - 400 K/uL 171 200   Lipid Panel     Component Value Date/Time   CHOL 217 (H) 10/24/2018 0807   TRIG 178 (H) 10/24/2018 0807   HDL 40 10/24/2018 0807   LDLCALC 145 (H) 10/24/2018 0807    PRN Meds:. Medications Discontinued During This Encounter  Medication Reason  . diazepam (VALIUM) 2 MG tablet Error  .  simvastatin (ZOCOR) 40 MG tablet Error   Current Meds  Medication Sig  . acetaminophen (TYLENOL) 500 MG tablet Take 500 mg by mouth every 6 (six) hours as needed.  01.6 amLODipine-benazepril (LOTREL) 5-20 MG capsule TAKE 1 CAPSULE BY MOUTH ONCE DAILY IN THE EVENING (DISCONTINUE  LISINOPRIL)  . Multiple Vitamin (MULTIVITAMIN) tablet Take 1 tablet by mouth daily.  12/24/2018 omeprazole (PRILOSEC) 40 MG capsule Take 40 mg by mouth daily.  . propranolol ER (INDERAL LA) 80 MG 24 hr capsule Take 1 capsule by mouth once daily in the morning    Cardiac Studies:   Echocardiogram 08/21/2018: Normal LV systolic function with EF 55%. Left ventricle cavity  is normal in size. Normal global wall motion. Doppler evidence of grade I (impaired) diastolic dysfunction, normal LAP.  Left atrial cavity is normal in size. Aneurysmal interatrial septum without 2D or color Doppler evidence of interatrial shunt. Moderate (Grade II) mitral regurgitation. Mild tricuspid regurgitation. Estimated pulmonary artery systolic pressure is 25 mmHg.  Assessment     ICD-10-CM   1. Mixed hyperlipidemia  E78.2 rosuvastatin (CRESTOR) 10 MG tablet    Lipid Panel With LDL/HDL Ratio    Vitamin D 1,25 dihydroxy  2. Frequent PVCs  I49.3   3. Essential hypertension  I10 Vitamin D 1,25 dihydroxy   EKG 07/16/2018: Normal sinus rhythm/sinus tachycardia at rate of 110 bpm, left atrial abnormality, no evidence of ischemia, frequent PVCs.(4)  Recommendations:   Patient presents for follow-up of hypertension, frequent PVCs noted on the EKG, underwent echocardiogram.  Since being on amlodipine/benazepril and also propranolol, blood pressure is normal at home and also her anxiety has improved significantly.  Also she used to be tachycardic at rest, this is also improved.  Her blood pressure is markedly elevated today but I did not make any changes as patient was very clear that her blood pressure is very well controlled and she will continue to watch  this.  I reviewed her labs, lipids show mixed hyperlipidemia and she does have hyperglycemia.  She did not tolerate simvastatin, but she is willing to try statin, I prescribed her Crestor 10 mg daily.  She will obtain lipids in 6 weeks.  I will also obtain vitamin D to evaluate for statin intolerance.  I would like to see her back in 6 months or sooner if problems.  Due to frequent PVCs noted on her prior EKG, also was complaining of shortness of breath and chest pain, I had recommended nuclear stress test which he has not obtained however will continue observation for now but if she has any recurrence of pain will consider repeating the stress test. Patient aware.  Yates DecampJay Kieffer Blatz, MD, [facility] 11/21/2018, 6:49 PM [facility] Pager: [phone] Office: [phone] If no answer Cell [phone]

## 2018-11-21 NOTE — Patient Instructions (Addendum)
Please obtain labs in 6 weeks. I will call her with the results.  I had given Your medical record access to your daughter at your request, please ask her to activate this link.

## 2018-11-27 DIAGNOSIS — R69 Illness, unspecified: Secondary | ICD-10-CM | POA: Diagnosis not present

## 2018-12-11 ENCOUNTER — Other Ambulatory Visit: Payer: Self-pay

## 2018-12-11 DIAGNOSIS — I10 Essential (primary) hypertension: Secondary | ICD-10-CM

## 2018-12-11 DIAGNOSIS — I493 Ventricular premature depolarization: Secondary | ICD-10-CM

## 2018-12-11 MED ORDER — PROPRANOLOL HCL ER 80 MG PO CP24: ORAL_CAPSULE | ORAL | 1 refills | Status: DC

## 2018-12-11 MED ORDER — AMLODIPINE BESY-BENAZEPRIL HCL 5-20 MG PO CAPS: ORAL_CAPSULE | ORAL | 1 refills | Status: DC

## 2019-05-22 ENCOUNTER — Ambulatory Visit: Payer: Medicare HMO | Admitting: Cardiology

## 2019-06-20 LAB — LIPID PANEL WITH LDL/HDL RATIO
Cholesterol, Total: 144 mg/dL (ref 100–199)
HDL: 41 mg/dL (ref >39)
LDL Chol Calc (NIH): 79 mg/dL (ref 0–99)
LDL/HDL Ratio: 1.9 ratio (ref 0.0–3.2)
Triglycerides: 137 mg/dL (ref 0–149)
VLDL Cholesterol Cal: 24 mg/dL (ref 5–40)

## 2019-06-20 LAB — VITAMIN D 1,25 DIHYDROXY
Vitamin D 1, 25 (OH)2 Total: 87 pg/mL — ABNORMAL HIGH
Vitamin D2 1, 25 (OH)2: <10 pg/mL
Vitamin D3 1, 25 (OH)2: 86 pg/mL

## 2019-06-20 NOTE — Progress Notes (Signed)
Excellent control of lipids on 10 mg of Crestor, please continue same.  Vitamin D elevated, patient on multivitamin, will discontinue vitamin D supplement.  Will discuss with patient visit soon.  Copy sent to PCP.

## 2019-06-24 ENCOUNTER — Ambulatory Visit: Payer: Medicare HMO | Admitting: Cardiology

## 2019-07-08 ENCOUNTER — Other Ambulatory Visit: Payer: Self-pay | Admitting: Cardiology

## 2019-07-08 DIAGNOSIS — I493 Ventricular premature depolarization: Secondary | ICD-10-CM

## 2019-07-08 DIAGNOSIS — I10 Essential (primary) hypertension: Secondary | ICD-10-CM

## 2019-07-11 NOTE — Progress Notes (Signed)
Primary Physician/Referring:  [name], DO  Patient ID: Emily Booth, female    DOB: [birth year], 74 y.o.   MRN: [id number]  Chief Complaint  Patient presents with  . Follow-up    6 months  . Hypertension    pt c/o swelling in ankles  . Hyperlipidemia  . Edema   HPI:    Emily Booth  is a 74 y.o. Caucasian female  with Hypertension, severe anxiety, palpitations and atypical chest pain.  This is her annual visit, states that she is feeling the best she has in quite a while, states that her blood pressure has been very well controlled and since being on present medications, she has not had any anxiety or palpitations or chest pain.  Also states that she has noticed minimal leg swelling since being on the present medicines but does not bother her.   Past Medical History:  Diagnosis Date  . Acid reflux   . Anxiety   . Hyperlipidemia   . Hypertension    Past Surgical History:  Procedure Laterality Date  . ANTERIOR AND POSTERIOR REPAIR N/A 09/06/2017   Procedure: ANTERIOR (CYSTOCELE) AND POSTERIOR REPAIR (RECTOCELE);  Surgeon: Lavina Hamman, MD;  Location: [facility];  Service: Gynecology;  Laterality: N/A;  . cataract surgery      bilateral   . CYSTOSCOPY N/A 09/06/2017   Procedure: CYSTOSCOPY;  Surgeon: Lavina Hamman, MD;  Location: [facility];  Service: Gynecology;  Laterality: N/A;  . VAGINAL HYSTERECTOMY N/A 09/06/2017   Procedure: HYSTERECTOMY VAGINAL;  Surgeon: Lavina Hamman, MD;  Location: [facility];  Service: Gynecology;  Laterality: N/A;  . WRIST SURGERY     Family History  Problem Relation Age of Onset  . Hypertension Mother   . Heart disease Brother   . Heart attack Brother     Social History   Tobacco Use  . Smoking status: Never Smoker  . Smokeless tobacco: Never Used  Substance Use Topics  . Alcohol use: Yes    Comment: Rarely   Marital Status: Single  ROS  Review of Systems   Cardiovascular: Positive for leg swelling (occasional). Negative for chest pain and dyspnea on exertion.  Gastrointestinal: Negative for melena.   Objective  Blood pressure (!) 178/84, pulse 66, height 5\' 5"  (1.651 m), weight 149 lb (67.6 kg), SpO2 97 %.  Vitals with BMI 07/15/2019 11/21/2018 08/29/2018  Height 5\' 5"  5\' 5"  5\' 5"   Weight 149 lbs 145 lbs 145 lbs  BMI 24.79 24.13 24.13  Systolic 178 195 08/31/2018  Diastolic 84 90 65  Pulse 66 73 70     Physical Exam  Constitutional: She appears well-developed and well-nourished. No distress.  Cardiovascular: Normal rate, regular rhythm and intact distal pulses. Exam reveals no gallop.  No murmur heard. Trace ankle edema, no JVD.   Pulmonary/Chest: Effort normal and breath sounds normal. No accessory muscle usage.  Abdominal: Soft. Bowel sounds are normal.   Laboratory examination:   Recent Labs    08/15/18 0839 10/24/18 0807  NA 143 142  K 4.0 4.1  CL 103 106  CO2 22 24  GLUCOSE 115* 102*  BUN 18 18  CREATININE 0.68 0.68  CALCIUM 10.5* 10.3  GFRNONAA 87 87  GFRAA 100 100   CrCl cannot be calculated (Patient's most recent lab result is older than the maximum 21 days allowed.).  CMP Latest Ref Rng & Units 10/24/2018 08/15/2018 08/27/2017  Glucose 65 - 99 mg/dL 12/24/18) 12/24/2018) 10/16/2018)  BUN 8 - 27 mg/dL 18 18 17   Creatinine 0.57 - 1.00 mg/dL 6.38 9.37  Sodium 134 - 144 mmol/L 142 143 143  Potassium 3.5 - 5.2 mmol/L 4.1 4.0 3.8  Chloride 96 - 106 mmol/L 106 103 112(H)  CO2 20 - 29 mmol/L 24 22 25   Calcium 8.7 - 10.3 mg/dL 3.42 10.5(H) 10.0  Total Protein 6.0 - 8.5 g/dL 7.0 6.9 -  Total Bilirubin 0.0 - 1.2 mg/dL 0.6 0.8 -  Alkaline Phos 39 - 117 IU/L 69 67 -  AST 0 - 40 IU/L 20 14 -  ALT 0 - 32 IU/L 13 13 -   CBC Latest Ref Rng & Units 09/07/2017 08/27/2017  WBC 4.0 - 10.5 K/uL 14.8(H) 7.1  Hemoglobin 12.0 - 15.0 g/dL 09/09/2017 08/29/2017  Hematocrit 81.1 - 46.0 % 36.8 41.5  Platelets 150 - 400 K/uL 171 200   Lipid Panel     Component  Value Date/Time   CHOL 144 06/11/2019 0810   TRIG 137 06/11/2019 0810   HDL 41 06/11/2019 0810   LDLCALC 79 06/11/2019 0810   HEMOGLOBIN A1C No results found for: HGBA1C, MPG TSH Recent Labs    08/15/18 0839  TSH 1.240    External labs:   A1C 5.900 % 09/21/2017  Medications and allergies  No Known Allergies   Current Outpatient Medications  Medication Instructions  . acetaminophen (TYLENOL) 500 mg, Oral, Every 6 hours PRN  . amLODipine-benazepril (LOTREL) 5-20 MG capsule TAKE 1 CAPSULE BY MOUTH ONCE DAILY IN THE EVENING (DISCONTINUE LISINOPRIL)  . Multiple Vitamin (MULTIVITAMIN) tablet 1 tablet, Oral, Daily  . omeprazole (PRILOSEC) 40 mg, Oral, As needed  . propranolol ER (INDERAL LA) 80 MG 24 hr capsule Take 1 capsule by mouth once daily in the morning  . rosuvastatin (CRESTOR) 10 mg, Oral, Daily   Radiology:   No results found.  Cardiac Studies:   Echocardiogram 08/21/2018: Normal LV systolic function with EF 55%. Left ventricle cavity is normal in size. Normal global wall motion. Doppler evidence of grade I (impaired) diastolic dysfunction, normal LAP.  Left atrial cavity is normal in size. Aneurysmal interatrial septum without 2D or color Doppler evidence of interatrial shunt. Moderate (Grade II) mitral regurgitation. Mild tricuspid regurgitation. Estimated pulmonary artery systolic pressure is 25 mmHg.   EKG  EKG 07/15/2019: Normal sinus rhythm at rate of 60 bpm, normal axis.  No evidence of ischemia, borderline voltage criteria for LVH.  No significant change from 07/16/2018.  Frequent PVCs no longer present.   Assessment     ICD-10-CM   1. Essential hypertension  I10 EKG 12-Lead  2. Frequent PVCs  I49.3   3. Mixed hyperlipidemia  E78.2      Outpatient Encounter Medications as of 07/15/2019  Medication Sig  . acetaminophen (TYLENOL) 500 MG tablet Take 500 mg by mouth every 6 (six) hours as needed.  09/15/2018 amLODipine-benazepril (LOTREL) 5-20 MG capsule TAKE 1  CAPSULE BY MOUTH ONCE DAILY IN THE EVENING (DISCONTINUE LISINOPRIL)  . Multiple Vitamin (MULTIVITAMIN) tablet Take 1 tablet by mouth daily.  09/14/2019 omeprazole (PRILOSEC) 40 MG capsule Take 40 mg by mouth as needed.   . propranolol ER (INDERAL LA) 80 MG 24 hr capsule Take 1 capsule by mouth once daily in the morning  . rosuvastatin (CRESTOR) 10 MG tablet Take 1 tablet (10 mg total) by mouth daily.   No facility-administered encounter medications on file as of 07/15/2019.    Recommendations:   Emily Booth  is a  74 y.o. Caucasian female  with Hypertension, severe anxiety, palpitations and atypical chest pain.  Patient presents for follow-up of hypertension, frequent PVCs noted on the EKG, underwent echocardiogram.  Since being on amlodipine/benazepril and also propranolol, blood pressure is normal at home and also her anxiety has improved significantly.  Also she used to be tachycardic at rest, this is also improved.   Her blood pressure is markedly elevated today but home recordings of the blood pressure has been under excellent control with systolic blood pressure between 120 to 100 mmHg.  I did not make any changes.  With regard to hyperlipidemia, with Crestor, lipids are under excellent control.  Advised her to continue the same.  Due to frequent PVCs noted on her prior EKG, also was complaining of shortness of breath and chest pain, since being on propranolol, she is on the best she has without recurrence of PVCs, no recurrence of chest pain and blood pressure is also well controlled and states that it does help her anxiety as well.  Hence continue present medication, I will see him back on a as needed basis.  Will request Dr. Janie Morning to take over her care including prescription refills.   Adrian Prows, MD, [facility] 07/15/2019, 4:44 PM [facility] Pager: [phone] Office: [phone]

## 2019-07-15 ENCOUNTER — Other Ambulatory Visit: Payer: Self-pay

## 2019-07-15 ENCOUNTER — Ambulatory Visit: Payer: Medicare HMO | Admitting: Cardiology

## 2019-07-15 ENCOUNTER — Encounter: Payer: Self-pay | Admitting: Cardiology

## 2019-07-15 VITALS — BP 178/84 | HR 66 | Ht 65.0 in | Wt 149.0 lb

## 2019-07-15 DIAGNOSIS — I493 Ventricular premature depolarization: Secondary | ICD-10-CM

## 2019-07-15 DIAGNOSIS — E782 Mixed hyperlipidemia: Secondary | ICD-10-CM

## 2019-07-15 DIAGNOSIS — I10 Essential (primary) hypertension: Secondary | ICD-10-CM

## 2019-08-05 ENCOUNTER — Other Ambulatory Visit: Payer: Self-pay

## 2019-08-05 ENCOUNTER — Ambulatory Visit (INDEPENDENT_AMBULATORY_CARE_PROVIDER_SITE_OTHER): Payer: Medicare HMO | Admitting: Ophthalmology

## 2019-08-05 ENCOUNTER — Encounter (INDEPENDENT_AMBULATORY_CARE_PROVIDER_SITE_OTHER): Payer: Self-pay | Admitting: Ophthalmology

## 2019-08-05 DIAGNOSIS — H35051 Retinal neovascularization, unspecified, right eye: Secondary | ICD-10-CM | POA: Diagnosis not present

## 2019-08-05 DIAGNOSIS — H353114 Nonexudative age-related macular degeneration, right eye, advanced atrophic with subfoveal involvement: Secondary | ICD-10-CM | POA: Diagnosis not present

## 2019-08-05 DIAGNOSIS — H353212 Exudative age-related macular degeneration, right eye, with inactive choroidal neovascularization: Secondary | ICD-10-CM | POA: Insufficient documentation

## 2019-08-05 DIAGNOSIS — H353122 Nonexudative age-related macular degeneration, left eye, intermediate dry stage: Secondary | ICD-10-CM

## 2019-08-05 HISTORY — DX: Retinal neovascularization, unspecified, right eye: H35.051

## 2019-08-05 NOTE — Progress Notes (Signed)
08/05/2019     CHIEF COMPLAINT Patient presents for Retina Follow Up   HISTORY OF PRESENT ILLNESS: Emily Booth is a 74 y.o. female who presents to the clinic today for:   HPI    Retina Follow Up    Patient presents with  Dry AMD.  In both eyes.  This started 1 year ago.  Severity is mild.  Duration of 1 year.  Since onset it is stable.          Comments    1 Year AMD F/U OU  Pt denies noticeable changes to [place] OU since last visit. Pt denies ocular pain, flashes of light, or floaters OU.         Last edited by Emily Booth, COA on 08/05/2019  8:10 AM. (History)      Referring physician: [name], DO [address] [place],  [place] [address]  HISTORICAL INFORMATION:   Selected notes from the MEDICAL RECORD NUMBER       CURRENT MEDICATIONS: No current outpatient medications on file. (Ophthalmic Drugs)   No current facility-administered medications for this visit. (Ophthalmic Drugs)   Current Outpatient Medications (Other)  Medication Sig   acetaminophen (TYLENOL) 500 MG tablet Take 500 mg by mouth every 6 (six) hours as needed.   amLODipine-benazepril (LOTREL) 5-20 MG capsule TAKE 1 CAPSULE BY MOUTH ONCE DAILY IN THE EVENING (DISCONTINUE LISINOPRIL)   Multiple Vitamin (MULTIVITAMIN) tablet Take 1 tablet by mouth daily.   omeprazole (PRILOSEC) 40 MG capsule Take 40 mg by mouth as needed.    propranolol ER (INDERAL LA) 80 MG 24 hr capsule Take 1 capsule by mouth once daily in the morning   rosuvastatin (CRESTOR) 10 MG tablet Take 1 tablet (10 mg total) by mouth daily.   No current facility-administered medications for this visit. (Other)      REVIEW OF SYSTEMS:    ALLERGIES No Known Allergies  PAST MEDICAL HISTORY Past Medical History:  Diagnosis Date   Acid reflux    Anxiety    Hyperlipidemia    Hypertension    Past Surgical History:  Procedure Laterality Date   ANTERIOR AND POSTERIOR REPAIR N/A 09/06/2017    Procedure: ANTERIOR (CYSTOCELE) AND POSTERIOR REPAIR (RECTOCELE);  Surgeon: Lavina Hamman, MD;  Location: [facility];  Service: Gynecology;  Laterality: N/A;   cataract surgery      bilateral    CYSTOSCOPY N/A 09/06/2017   Procedure: CYSTOSCOPY;  Surgeon: Lavina Hamman, MD;  Location: [facility];  Service: Gynecology;  Laterality: N/A;   VAGINAL HYSTERECTOMY N/A 09/06/2017   Procedure: HYSTERECTOMY VAGINAL;  Surgeon: Lavina Hamman, MD;  Location: [facility];  Service: Gynecology;  Laterality: N/A;   WRIST SURGERY      FAMILY HISTORY Family History  Problem Relation Age of Onset   Hypertension Mother    Heart disease Brother    Heart attack Brother     SOCIAL HISTORY Social History   Tobacco Use   Smoking status: Never Smoker   Smokeless tobacco: Never Used  Building services engineer Use: Never used  Substance Use Topics   Alcohol use: Yes    Comment: Rarely   Drug use: Never         OPHTHALMIC EXAM: Base Eye Exam    Visual Acuity (ETDRS)      Right Left   Dist cc CF @ 1' 20/20 -2   Dist ph cc NI    Correction: Glasses  Tonometry (Tonopen, 8:13 AM)      Right Left   Pressure 14 12       Pupils      Pupils Dark Light Shape React APD   Right PERRL 4 3 Round Brisk None   Left PERRL 4 3 Round Brisk None       Visual Fields (Counting fingers)      Left Right    Full Full       Extraocular Movement      Right Left    Full Full       Neuro/Psych    Oriented x3: Yes   Mood/Affect: Normal       Dilation    Both eyes: 1.0% Mydriacyl, 2.5% Phenylephrine @ 8:13 AM        Slit Lamp and Fundus Exam    External Exam      Right Left   External Normal Normal       Slit Lamp Exam      Right Left   Lens Centered posterior chamber intraocular lens Centered posterior chamber intraocular lens       Fundus Exam      Right Left   Posterior Vitreous Posterior vitreous detachment, Central  vitreous floaters Normal   Disc Normal Normal   C/D Ratio 0.05 0.05   Macula Geographic atrophy, no macular thickening, no hemorrhage, no exudates Retinal pigment epithelial mottling, Hard drusen, no hemorrhage, no macular thickening   Vessels Normal Normal   Periphery Normal Normal          IMAGING AND PROCEDURES  Imaging and Procedures for 08/05/19           ASSESSMENT/PLAN:  No problem-specific Assessment & Plan notes found for this encounter.      ICD-10-CM   1. Exudative age-related macular degeneration of right eye with inactive choroidal neovascularization (HCC)  H35.3212 OCT, Retina - OU - Both Eyes  2. Advanced nonexudative age-related macular degeneration of right eye with subfoveal involvement  H35.3114   3. Choroidal neovascularization of right eye  H35.051     1.  2.  3.  Ophthalmic Meds Ordered this visit:  No orders of the defined types were placed in this encounter.      No follow-ups on file.  There are no Patient Instructions on file for this visit.   Explained the diagnoses, plan, and follow up with the patient and they expressed understanding.  Patient expressed understanding of the importance of proper follow up care.   Emily Booth Emily Booth M.D. Diseases & Surgery of the Retina and Vitreous [facility] [place] 08/05/19     Abbreviations: M myopia (nearsighted); A astigmatism; H hyperopia (farsighted); P presbyopia; Mrx spectacle prescription;  CTL contact lenses; OD right eye; OS left eye; OU both eyes  XT exotropia; ET esotropia; PEK punctate epithelial keratitis; PEE punctate epithelial erosions; DES dry eye syndrome; MGD meibomian gland dysfunction; ATs artificial tears; PFAT's preservative free artificial tears; [place] nuclear sclerotic cataract; PSC posterior subcapsular cataract; ERM epi-retinal membrane; PVD posterior vitreous detachment; RD retinal detachment; DM diabetes mellitus; DR diabetic retinopathy; NPDR non-proliferative  diabetic retinopathy; PDR proliferative diabetic retinopathy; CSME clinically significant macular edema; DME diabetic macular edema; dbh dot blot hemorrhages; CWS cotton wool spot; POAG primary open angle glaucoma; C/D cup-to-disc ratio; HVF humphrey visual field; GVF goldmann visual field; OCT optical coherence tomography; IOP intraocular pressure; BRVO Branch retinal vein occlusion; CRVO central retinal vein occlusion; CRAO central retinal artery occlusion; BRAO branch retinal  artery occlusion; RT retinal tear; SB scleral buckle; PPV pars plana vitrectomy; VH Vitreous hemorrhage; PRP panretinal laser photocoagulation; IVK intravitreal kenalog; VMT vitreomacular traction; MH Macular hole;  NVD neovascularization of the disc; NVE neovascularization elsewhere; AREDS age related eye disease study; ARMD age related macular degeneration; POAG primary open angle glaucoma; EBMD epithelial/anterior basement membrane dystrophy; ACIOL anterior chamber intraocular lens; IOL intraocular lens; PCIOL posterior chamber intraocular lens; Phaco/IOL phacoemulsification with intraocular lens placement; PRK photorefractive keratectomy; LASIK laser assisted in situ keratomileusis; HTN hypertension; DM diabetes mellitus; COPD chronic obstructive pulmonary disease

## 2019-09-30 ENCOUNTER — Other Ambulatory Visit: Payer: Self-pay | Admitting: Cardiology

## 2019-09-30 DIAGNOSIS — I10 Essential (primary) hypertension: Secondary | ICD-10-CM

## 2019-09-30 DIAGNOSIS — I493 Ventricular premature depolarization: Secondary | ICD-10-CM

## 2019-12-28 ENCOUNTER — Other Ambulatory Visit: Payer: Self-pay | Admitting: Cardiology

## 2019-12-28 DIAGNOSIS — E782 Mixed hyperlipidemia: Secondary | ICD-10-CM

## 2019-12-31 ENCOUNTER — Other Ambulatory Visit: Payer: Self-pay | Admitting: Cardiology

## 2019-12-31 DIAGNOSIS — I493 Ventricular premature depolarization: Secondary | ICD-10-CM

## 2019-12-31 DIAGNOSIS — I10 Essential (primary) hypertension: Secondary | ICD-10-CM

## 2019-12-31 NOTE — Telephone Encounter (Signed)
Refill request

## 2020-04-01 ENCOUNTER — Other Ambulatory Visit: Payer: Self-pay | Admitting: Cardiology

## 2020-04-01 DIAGNOSIS — I493 Ventricular premature depolarization: Secondary | ICD-10-CM

## 2020-04-01 DIAGNOSIS — I10 Essential (primary) hypertension: Secondary | ICD-10-CM

## 2020-04-01 DIAGNOSIS — E782 Mixed hyperlipidemia: Secondary | ICD-10-CM

## 2020-07-01 ENCOUNTER — Other Ambulatory Visit: Payer: Self-pay | Admitting: Cardiology

## 2020-07-01 DIAGNOSIS — I10 Essential (primary) hypertension: Secondary | ICD-10-CM

## 2020-07-01 DIAGNOSIS — E782 Mixed hyperlipidemia: Secondary | ICD-10-CM

## 2020-07-01 DIAGNOSIS — I493 Ventricular premature depolarization: Secondary | ICD-10-CM

## 2020-07-06 ENCOUNTER — Other Ambulatory Visit: Payer: Self-pay | Admitting: Cardiology

## 2020-07-06 DIAGNOSIS — I10 Essential (primary) hypertension: Secondary | ICD-10-CM

## 2020-07-06 DIAGNOSIS — I493 Ventricular premature depolarization: Secondary | ICD-10-CM

## 2020-07-08 ENCOUNTER — Telehealth: Payer: Self-pay

## 2020-07-08 NOTE — Telephone Encounter (Signed)
Patient called and left a VM, said she was told that she needed to schedule an appointment to for additional refills. She asked that we call and leave her a message about scheduling, and I called but she did not answer and no VM set up to leave a message. Please try to contact patient to schedule that appointment then transfer to MA's to refill a 1 month supply of her BP medication. Thank you.

## 2020-07-22 NOTE — Progress Notes (Signed)
Primary Physician/Referring:  [name], DO  Patient ID: Emily Booth, female    DOB: September 21, 1945, 75 y.o.   MRN: [id number]  Chief Complaint  Patient presents with   Hypertension   Hyperlipidemia   Medication Refill   Follow-up   HPI:    Emily Booth  is a 75 y.o. Caucasian female  with hypertension, severe anxiety, hyperlipidemia, palpitations and atypical chest pain.    Patient was last seen by Dr. Jacinto Halim 07/15/2019 and was advised to follow-up with her PCP for further management, including prescription refills.  However patient now presents for annual follow-up of hypertension, hyperlipidemia. Patient reports she has upcoming labs and annual physical with PCP. She admits she did not request for PCP to take over management of patient's blood pressure as advised by Dr. Jacinto Halim at last visit.   Patient's primary concern today is that propranolol is causing her dizziness and episodes of low blood pressure at home in the mornings. She brings with her a written log on home BP readings which are well controlled but do exhibit episodes of low blood pressure. Patient states she has not taken her propranolol for a couple days and when she skips it her dizziness is better.   Notably patient's blood pressure is elevated in the office today, but she reports a long-standing history of white coat hypertension. Denies chest pain, dyspnea, palpitations, syncope, near-syncope. Denies symptoms suggestive of TIA/CVA.   Past Medical History:  Diagnosis Date   Acid reflux    Anxiety    Choroidal neovascularization of right eye 08/05/2019   Hyperlipidemia    Hypertension    Past Surgical History:  Procedure Laterality Date   ANTERIOR AND POSTERIOR REPAIR N/A 09/06/2017   Procedure: ANTERIOR (CYSTOCELE) AND POSTERIOR REPAIR (RECTOCELE);  Surgeon: Lavina Hamman, MD;  Location: [facility];  Service: Gynecology;  Laterality: N/A;   cataract surgery      bilateral     CYSTOSCOPY N/A 09/06/2017   Procedure: CYSTOSCOPY;  Surgeon: Lavina Hamman, MD;  Location: [facility];  Service: Gynecology;  Laterality: N/A;   VAGINAL HYSTERECTOMY N/A 09/06/2017   Procedure: HYSTERECTOMY VAGINAL;  Surgeon: Lavina Hamman, MD;  Location: [facility];  Service: Gynecology;  Laterality: N/A;   WRIST SURGERY     Family History  Problem Relation Age of Onset   Hypertension Mother    Heart disease Brother    Heart attack Brother     Social History   Tobacco Use   Smoking status: Never   Smokeless tobacco: Never  Substance Use Topics   Alcohol use: Not Currently    Comment: Rarely   Marital Status: Single  ROS  Review of Systems  Constitutional: Negative for malaise/fatigue and weight gain.  Cardiovascular:  Positive for leg swelling (occasional). Negative for chest pain, claudication, dyspnea on exertion, near-syncope, orthopnea, palpitations, paroxysmal nocturnal dyspnea and syncope.  Respiratory:  Negative for shortness of breath.   Hematologic/Lymphatic: Does not bruise/bleed easily.  Gastrointestinal:  Negative for melena.  Neurological:  Positive for dizziness. Negative for weakness.  Objective  Blood pressure (!) 160/86, pulse 73, temperature 97.8 F (36.6 C), temperature source Temporal, resp. rate 16, height 5\' 5"  (1.651 m), weight 146 lb 9.6 oz (66.5 kg), SpO2 96 %.  Vitals with BMI 07/23/2020 07/15/2019 11/21/2018  Height 5\' 5"  5\' 5"  5\' 5"   Weight 146 lbs 10 oz 149 lbs 145 lbs  BMI 24.4 24.79 24.13  Systolic 160 178 01/21/2019  Diastolic 86  84 90  Pulse 73 66 73     Physical Exam Constitutional:      General: She is not in acute distress.    Appearance: She is well-developed.  Cardiovascular:     Rate and Rhythm: Normal rate and regular rhythm.     Pulses: Intact distal pulses.     Heart sounds: No murmur heard.   No gallop.     Comments: No JVD Pulmonary:     Effort: Pulmonary effort is normal. No accessory muscle  usage.     Breath sounds: Normal breath sounds.  Abdominal:     General: Bowel sounds are normal.     Palpations: Abdomen is soft.  Musculoskeletal:     Right lower leg: No edema.     Left lower leg: No edema.   Laboratory examination:   No results for input(s): NA, K, CL, CO2, GLUCOSE, BUN, CREATININE, CALCIUM, GFRNONAA, GFRAA in the last 8760 hours.  CrCl cannot be calculated (Patient's most recent lab result is older than the maximum 21 days allowed.).  CMP Latest Ref Rng & Units 10/24/2018 08/15/2018 08/27/2017  Glucose 65 - 99 mg/dL 403(K) 742(V) 956(L)  BUN 8 - 27 mg/dL 18 18 17   Creatinine 0.57 - 1.00 mg/dL 8.75 6.43  Sodium 134 - 144 mmol/L 142 143 143  Potassium 3.5 - 5.2 mmol/L 4.1 4.0 3.8  Chloride 96 - 106 mmol/L 106 103 112(H)  CO2 20 - 29 mmol/L 24 22 25   Calcium 8.7 - 10.3 mg/dL 3.29 10.5(H) 10.0  Total Protein 6.0 - 8.5 g/dL 7.0 6.9 -  Total Bilirubin 0.0 - 1.2 mg/dL 0.6 0.8 -  Alkaline Phos 39 - 117 IU/L 69 67 -  AST 0 - 40 IU/L 20 14 -  ALT 0 - 32 IU/L 13 13 -   CBC Latest Ref Rng & Units 09/07/2017 08/27/2017  WBC 4.0 - 10.5 K/uL 14.8(H) 7.1  Hemoglobin 12.0 - 15.0 g/dL 09/09/2017 08/29/2017  Hematocrit 84.1 - 46.0 % 36.8 41.5  Platelets 150 - 400 K/uL 171 200   Lipid Panel     Component Value Date/Time   CHOL 144 06/11/2019 0810   TRIG 137 06/11/2019 0810   HDL 41 06/11/2019 0810   LDLCALC 79 06/11/2019 0810   HEMOGLOBIN A1C No results found for: HGBA1C, MPG TSH No results for input(s): TSH in the last 8760 hours.   External labs:   A1C 5.900 % 09/21/2017 Allergies  No Known Allergies    Medications Prior to Visit:   Outpatient Medications Prior to Visit  Medication Sig Dispense Refill   acetaminophen (TYLENOL) 500 MG tablet Take 500 mg by mouth every 6 (six) hours as needed.     Multiple Vitamin (MULTIVITAMIN) tablet Take 1 tablet by mouth daily.     amLODipine-benazepril (LOTREL) 5-20 MG capsule TAKE 1 CAPSULE BY MOUTH ONCE DAILY IN THE EVENING 30  capsule 0   propranolol ER (INDERAL LA) 80 MG 24 hr capsule Take 1 capsule by mouth once daily in the morning 30 capsule 0   rosuvastatin (CRESTOR) 10 MG tablet Take 1 tablet by mouth once daily 90 tablet 0   omeprazole (PRILOSEC) 40 MG capsule Take 40 mg by mouth as needed.      No facility-administered medications prior to visit.   Final Medications at End of Visit    Current Meds  Medication Sig   acetaminophen (TYLENOL) 500 MG tablet Take 500 mg by mouth every 6 (six) hours as needed.  amLODipine-benazepril (LOTREL) 5-20 MG capsule Take 1 capsule by mouth daily.   Multiple Vitamin (MULTIVITAMIN) tablet Take 1 tablet by mouth daily.   [DISCONTINUED] amLODipine-benazepril (LOTREL) 5-20 MG capsule TAKE 1 CAPSULE BY MOUTH ONCE DAILY IN THE EVENING   [DISCONTINUED] propranolol ER (INDERAL LA) 80 MG 24 hr capsule Take 1 capsule by mouth once daily in the morning   [DISCONTINUED] rosuvastatin (CRESTOR) 10 MG tablet Take 1 tablet by mouth once daily   Radiology:   No results found.  Cardiac Studies:   Echocardiogram 08/21/2018: Normal LV systolic function with EF 55%. Left ventricle cavity is normal in size. Normal global wall motion. Doppler evidence of grade I (impaired) diastolic dysfunction, normal LAP.  Left atrial cavity is normal in size. Aneurysmal interatrial septum without 2D or color Doppler evidence of interatrial shunt. Moderate (Grade II) mitral regurgitation. Mild tricuspid regurgitation. Estimated pulmonary artery systolic pressure is 25 mmHg.  EKG  07/23/2020: Sinus rhythm at a rate of 70 bpm.  Normal axis.  Likely early repolarization abnormality, cannot exclude inferior ischemia.  Compared to EKG 07/15/2019, no significant change.  EKG 07/15/2019: Normal sinus rhythm at rate of 60 bpm, normal axis.  No evidence of ischemia, borderline voltage criteria for LVH.  No significant change from 07/16/2018.  Frequent PVCs no longer present.   Assessment     ICD-10-CM   1.  Essential hypertension  I10 EKG 12-Lead    2. Mixed hyperlipidemia  E78.2 EKG 12-Lead    rosuvastatin (CRESTOR) 10 MG tablet    3. Frequent PVCs  I49.3 EKG 12-Lead       Outpatient Encounter Medications as of 07/23/2020  Medication Sig Note   acetaminophen (TYLENOL) 500 MG tablet Take 500 mg by mouth every 6 (six) hours as needed.    amLODipine-benazepril (LOTREL) 5-20 MG capsule Take 1 capsule by mouth daily.    Multiple Vitamin (MULTIVITAMIN) tablet Take 1 tablet by mouth daily.    [DISCONTINUED] amLODipine-benazepril (LOTREL) 5-20 MG capsule TAKE 1 CAPSULE BY MOUTH ONCE DAILY IN THE EVENING    [DISCONTINUED] propranolol ER (INDERAL LA) 80 MG 24 hr capsule Take 1 capsule by mouth once daily in the morning 07/23/2020: dizziness    [DISCONTINUED] rosuvastatin (CRESTOR) 10 MG tablet Take 1 tablet by mouth once daily    rosuvastatin (CRESTOR) 10 MG tablet Take 1 tablet (10 mg total) by mouth daily.    [DISCONTINUED] omeprazole (PRILOSEC) 40 MG capsule Take 40 mg by mouth as needed.     No facility-administered encounter medications on file as of 07/23/2020.    Recommendations:   Emily Booth  is a [age] y.o. Caucasian female  with hypertension, severe anxiety, hyperlipidemia, palpitations and atypical chest pain.    Patient was last seen by Dr. Jacinto HalimGanji 07/15/2019 and was advised to follow-up with her PCP for further management, including prescription refills.  However patient now presents for annual follow-up of hypertension, hyperlipidemia, and frequent PVCs noted on EKG. patient has been experiencing dizziness and episodes of symptomatic hypotension particularly in the morning following her propranolol.  Patient states on days when she skips propranolol she feels better and blood pressure remains well controlled with average home reading 120/70 mmHg.  Patient notes a longstanding history of whitecoat hypertension, notably her blood pressure is elevated in the office today.  Patient is  otherwise asymptomatic.  Will discontinue propranolol due to dizziness and hypotension.  We will continue amlodipine-benazepril as well as rosuvastatin.  Patient has upcoming appointment with PCP at the end  of this month.  Will defer further management of hypertension hyperlipidemia to patient's PCP.  Advised patient to take home blood pressure monitor as well as written log of home blood pressure readings to her follow-up visit with PCP, patient verbalized understanding agreement.  Follow-up as needed.   Rayford Halsted, PA-C 07/23/2020, 12:52 PM Office: [phone]

## 2020-07-23 ENCOUNTER — Other Ambulatory Visit: Payer: Self-pay

## 2020-07-23 ENCOUNTER — Encounter: Payer: Self-pay | Admitting: Student

## 2020-07-23 ENCOUNTER — Ambulatory Visit: Payer: Medicare HMO | Admitting: Student

## 2020-07-23 VITALS — BP 160/86 | HR 73 | Temp 97.8°F | Resp 16 | Ht 65.0 in | Wt 146.6 lb

## 2020-07-23 DIAGNOSIS — I10 Essential (primary) hypertension: Secondary | ICD-10-CM

## 2020-07-23 DIAGNOSIS — I493 Ventricular premature depolarization: Secondary | ICD-10-CM

## 2020-07-23 DIAGNOSIS — E782 Mixed hyperlipidemia: Secondary | ICD-10-CM

## 2020-07-23 MED ORDER — AMLODIPINE BESY-BENAZEPRIL HCL 5-20 MG PO CAPS: 1.0000 | ORAL_CAPSULE | Freq: Every day | ORAL | 3 refills | Status: AC

## 2020-07-23 MED ORDER — ROSUVASTATIN CALCIUM 10 MG PO TABS: 10.0000 mg | ORAL_TABLET | Freq: Every day | ORAL | 3 refills | Status: AC

## 2020-08-03 ENCOUNTER — Encounter (INDEPENDENT_AMBULATORY_CARE_PROVIDER_SITE_OTHER): Payer: Medicare HMO | Admitting: Ophthalmology

## 2020-08-30 ENCOUNTER — Encounter (INDEPENDENT_AMBULATORY_CARE_PROVIDER_SITE_OTHER): Payer: Medicare HMO | Admitting: Ophthalmology

## 2020-09-13 ENCOUNTER — Other Ambulatory Visit: Payer: Self-pay

## 2020-09-13 ENCOUNTER — Encounter (INDEPENDENT_AMBULATORY_CARE_PROVIDER_SITE_OTHER): Payer: Self-pay | Admitting: Ophthalmology

## 2020-09-13 ENCOUNTER — Ambulatory Visit (INDEPENDENT_AMBULATORY_CARE_PROVIDER_SITE_OTHER): Payer: Medicare HMO | Admitting: Ophthalmology

## 2020-09-13 DIAGNOSIS — H353122 Nonexudative age-related macular degeneration, left eye, intermediate dry stage: Secondary | ICD-10-CM | POA: Diagnosis not present

## 2020-09-13 DIAGNOSIS — H43813 Vitreous degeneration, bilateral: Secondary | ICD-10-CM | POA: Insufficient documentation

## 2020-09-13 DIAGNOSIS — H353212 Exudative age-related macular degeneration, right eye, with inactive choroidal neovascularization: Secondary | ICD-10-CM

## 2020-09-13 DIAGNOSIS — Z961 Presence of intraocular lens: Secondary | ICD-10-CM

## 2020-09-13 NOTE — Assessment & Plan Note (Signed)
Stable no change over time OD

## 2020-09-13 NOTE — Assessment & Plan Note (Signed)
OS, no signs of active CNVM continue to observe

## 2020-09-13 NOTE — Assessment & Plan Note (Signed)
Looks great OU 

## 2020-09-13 NOTE — Progress Notes (Signed)
09/13/2020     CHIEF COMPLAINT Patient presents for Retina Follow Up (1 year follow up ou OCT /Patient reports stable vision. No new FOL. Patient states still having issues with floaters, some days are worse than others./)   HISTORY OF PRESENT ILLNESS: Emily Booth is a 75 y.o. female who presents to the clinic today for:   HPI     Retina Follow Up           Diagnosis: Wet AMD   Laterality: both eyes   Onset: 1 year ago   Severity: mild   Duration: 1 year   Course: stable   Comments: 1 year follow up ou OCT  Patient reports stable vision. No new FOL. Patient states still having issues with floaters, some days are worse than others.          Comments   No new distortions in either eye      Last edited by [name], MD on 09/13/2020  8:47 AM.      Referring physician: [name], DO [address] [place],  [place] [address]  HISTORICAL INFORMATION:   Selected notes from the MEDICAL RECORD NUMBER       CURRENT MEDICATIONS: No current outpatient medications on file. (Ophthalmic Drugs)   No current facility-administered medications for this visit. (Ophthalmic Drugs)   Current Outpatient Medications (Other)  Medication Sig   acetaminophen (TYLENOL) 500 MG tablet Take 500 mg by mouth every 6 (six) hours as needed.   amLODipine-benazepril (LOTREL) 5-20 MG capsule Take 1 capsule by mouth daily.   Multiple Vitamin (MULTIVITAMIN) tablet Take 1 tablet by mouth daily.   rosuvastatin (CRESTOR) 10 MG tablet Take 1 tablet (10 mg total) by mouth daily.   No current facility-administered medications for this visit. (Other)      REVIEW OF SYSTEMS:    ALLERGIES No Known Allergies  PAST MEDICAL HISTORY Past Medical History:  Diagnosis Date   Acid reflux    Anxiety    Choroidal neovascularization of right eye 08/05/2019   Hyperlipidemia    Hypertension    Past Surgical History:  Procedure Laterality Date   ANTERIOR AND POSTERIOR  REPAIR N/A 09/06/2017   Procedure: ANTERIOR (CYSTOCELE) AND POSTERIOR REPAIR (RECTOCELE);  Surgeon: Lavina Hamman, MD;  Location: [facility];  Service: Gynecology;  Laterality: N/A;   cataract surgery      bilateral    CYSTOSCOPY N/A 09/06/2017   Procedure: CYSTOSCOPY;  Surgeon: Lavina Hamman, MD;  Location: [facility];  Service: Gynecology;  Laterality: N/A;   VAGINAL HYSTERECTOMY N/A 09/06/2017   Procedure: HYSTERECTOMY VAGINAL;  Surgeon: Lavina Hamman, MD;  Location: [facility];  Service: Gynecology;  Laterality: N/A;   WRIST SURGERY      FAMILY HISTORY Family History  Problem Relation Age of Onset   Hypertension Mother    Heart disease Brother    Heart attack Brother     SOCIAL HISTORY Social History   Tobacco Use   Smoking status: Never   Smokeless tobacco: Never  Vaping Use   Vaping Use: Never used  Substance Use Topics   Alcohol use: Not Currently    Comment: Rarely   Drug use: Never         OPHTHALMIC EXAM:  Base Eye Exam     Visual Acuity (ETDRS)       Right Left   Dist cc CF at 3' 20/25 -1    Correction: Glasses  Tonometry (Tonopen, 8:33 AM)       Right Left   Pressure 13 10         Pupils       Pupils Dark Light Shape React APD   Right PERRL 4 3 Round Brisk None   Left PERRL 4 3 Round Brisk None         Visual Fields       Left Right    Full Full         Extraocular Movement       Right Left    Full Full         Neuro/Psych     Oriented x3: Yes   Mood/Affect: Normal         Dilation     Both eyes: 1.0% Mydriacyl, 2.5% Phenylephrine @ 8:33 AM           Slit Lamp and Fundus Exam     External Exam       Right Left   External Normal Normal         Slit Lamp Exam       Right Left   Lids/Lashes Normal Normal   Conjunctiva/Sclera White and quiet White and quiet   Cornea Clear Clear   Anterior Chamber Deep and quiet Deep and quiet   Iris  Round and reactive Round and reactive   Lens Centered posterior chamber intraocular lens Centered posterior chamber intraocular lens   Anterior Vitreous Normal Normal         Fundus Exam       Right Left   Posterior Vitreous Posterior vitreous detachment, Central vitreous floaters Posterior vitreous detachment   Disc Normal Normal   C/D Ratio 0.05 0.05   Macula Geographic atrophy, no macular thickening, no hemorrhage, no exudates Retinal pigment epithelial mottling, Hard drusen, no hemorrhage, no macular thickening   Vessels Normal Normal   Periphery Normal Normal            IMAGING AND PROCEDURES  Imaging and Procedures for 09/13/20  OCT, Retina - OU - Both Eyes       Right Eye Quality was good. Scan locations included subfoveal. Central Foveal Thickness: 351. Progression has been stable. Findings include abnormal foveal contour, no SRF, retinal drusen , no IRF, subretinal scarring.   Left Eye Quality was good. Scan locations included subfoveal. Central Foveal Thickness: 287. Progression has been stable. Findings include retinal drusen , no IRF, no SRF.   Notes No active CNVM lesion OU.  Large central disciform scar OD with no active edges to this lesion.             ASSESSMENT/PLAN:  Exudative age-related macular degeneration of right eye with inactive choroidal neovascularization (HCC) Stable no change over time OD  Intermediate stage nonexudative age-related macular degeneration of left eye OS, no signs of active CNVM continue to observe  Pseudophakia of both eyes Looks great OU  Posterior vitreous detachment of both eyes No holes or tears retinal periphery     ICD-10-CM   1. Exudative age-related macular degeneration of right eye with inactive choroidal neovascularization (HCC)  H35.3212 OCT, Retina - OU - Both Eyes    2. Intermediate stage nonexudative age-related macular degeneration of left eye  H35.3122     3. Pseudophakia of both eyes  Z96.1      4. Posterior vitreous detachment of both eyes  H43.813       1.  Patient instructed to notify the office or  her other eye doctor promptly of new onset visual acuity decline or distortions in either eye particularly the left  2.  3.  Ophthalmic Meds Ordered this visit:  No orders of the defined types were placed in this encounter.      Return in about 9 months (around 06/13/2021) for DILATE OU, OCT.  There are no Patient Instructions on file for this visit.   Explained the diagnoses, plan, and follow up with the patient and they expressed understanding.  Patient expressed understanding of the importance of proper follow up care.   Alford Highland Dent Plantz M.D. Diseases & Surgery of the Retina and Vitreous [facility] 09/13/20     Abbreviations: M myopia (nearsighted); A astigmatism; H hyperopia (farsighted); P presbyopia; Mrx spectacle prescription;  CTL contact lenses; OD right eye; OS left eye; OU both eyes  XT exotropia; ET esotropia; PEK punctate epithelial keratitis; PEE punctate epithelial erosions; DES dry eye syndrome; MGD meibomian gland dysfunction; ATs artificial tears; PFAT's preservative free artificial tears; NSC nuclear sclerotic cataract; PSC posterior subcapsular cataract; ERM epi-retinal membrane; PVD posterior vitreous detachment; RD retinal detachment; DM diabetes mellitus; DR diabetic retinopathy; NPDR non-proliferative diabetic retinopathy; PDR proliferative diabetic retinopathy; CSME clinically significant macular edema; DME diabetic macular edema; dbh dot blot hemorrhages; CWS cotton wool spot; POAG primary open angle glaucoma; C/D cup-to-disc ratio; HVF humphrey visual field; GVF goldmann visual field; OCT optical coherence tomography; IOP intraocular pressure; BRVO Branch retinal vein occlusion; CRVO central retinal vein occlusion; CRAO central retinal artery occlusion; BRAO branch retinal artery occlusion; RT retinal tear; SB scleral buckle; PPV  pars plana vitrectomy; VH Vitreous hemorrhage; PRP panretinal laser photocoagulation; IVK intravitreal kenalog; VMT vitreomacular traction; MH Macular hole;  NVD neovascularization of the disc; NVE neovascularization elsewhere; AREDS age related eye disease study; ARMD age related macular degeneration; POAG primary open angle glaucoma; EBMD epithelial/anterior basement membrane dystrophy; ACIOL anterior chamber intraocular lens; IOL intraocular lens; PCIOL posterior chamber intraocular lens; Phaco/IOL phacoemulsification with intraocular lens placement; PRK photorefractive keratectomy; LASIK laser assisted in situ keratomileusis; HTN hypertension; DM diabetes mellitus; COPD chronic obstructive pulmonary disease

## 2020-09-13 NOTE — Assessment & Plan Note (Signed)
No holes or tears retinal periphery

## 2021-06-13 ENCOUNTER — Encounter (INDEPENDENT_AMBULATORY_CARE_PROVIDER_SITE_OTHER): Payer: HMO | Admitting: Ophthalmology

## 2021-06-28 ENCOUNTER — Encounter (INDEPENDENT_AMBULATORY_CARE_PROVIDER_SITE_OTHER): Payer: HMO | Admitting: Ophthalmology

## 2021-07-05 ENCOUNTER — Encounter (INDEPENDENT_AMBULATORY_CARE_PROVIDER_SITE_OTHER): Payer: HMO | Admitting: Ophthalmology

## 2021-08-01 ENCOUNTER — Encounter (INDEPENDENT_AMBULATORY_CARE_PROVIDER_SITE_OTHER): Payer: HMO | Admitting: Ophthalmology

## 2021-08-01 ENCOUNTER — Encounter (INDEPENDENT_AMBULATORY_CARE_PROVIDER_SITE_OTHER): Payer: Self-pay | Admitting: Ophthalmology

## 2021-08-01 ENCOUNTER — Ambulatory Visit (INDEPENDENT_AMBULATORY_CARE_PROVIDER_SITE_OTHER): Payer: HMO | Admitting: Ophthalmology

## 2021-08-01 DIAGNOSIS — H353212 Exudative age-related macular degeneration, right eye, with inactive choroidal neovascularization: Secondary | ICD-10-CM | POA: Diagnosis not present

## 2021-08-01 DIAGNOSIS — H353114 Nonexudative age-related macular degeneration, right eye, advanced atrophic with subfoveal involvement: Secondary | ICD-10-CM | POA: Diagnosis not present

## 2021-08-01 DIAGNOSIS — H353122 Nonexudative age-related macular degeneration, left eye, intermediate dry stage: Secondary | ICD-10-CM

## 2021-08-01 NOTE — Assessment & Plan Note (Signed)
No sign of active CNVM 

## 2021-08-01 NOTE — Assessment & Plan Note (Signed)
OS no significant findings of geographic atrophy in the macular region

## 2021-08-01 NOTE — Assessment & Plan Note (Signed)
Extensive subfoveal geographic atrophy OD.

## 2021-08-01 NOTE — Progress Notes (Signed)
08/01/2021     CHIEF COMPLAINT Patient presents for  Chief Complaint  Patient presents with   Macular Degeneration      HISTORY OF PRESENT ILLNESS: Emily Booth is a 76 y.o. female who presents to the clinic today for:   HPI   9 mos fu OU OCT. Patient reports she was seen by her optometrist about 1 week ago. Patient is unsure of her name. Denies new FOL or floaters. Last edited by [name] on 08/01/2021  2:54 PM.      Referring physician: [name], DO [address] [place],  [place] [address]  HISTORICAL INFORMATION:   Selected notes from the MEDICAL RECORD NUMBER       CURRENT MEDICATIONS: No current outpatient medications on file. (Ophthalmic Drugs)   No current facility-administered medications for this visit. (Ophthalmic Drugs)   Current Outpatient Medications (Other)  Medication Sig   acetaminophen (TYLENOL) 500 MG tablet Take 500 mg by mouth every 6 (six) hours as needed.   amLODipine-benazepril (LOTREL) 5-20 MG capsule Take 1 capsule by mouth daily.   Multiple Vitamin (MULTIVITAMIN) tablet Take 1 tablet by mouth daily.   rosuvastatin (CRESTOR) 10 MG tablet Take 1 tablet (10 mg total) by mouth daily.   No current facility-administered medications for this visit. (Other)      REVIEW OF SYSTEMS: ROS   Negative for: Constitutional, Gastrointestinal, Neurological, Skin, Genitourinary, Musculoskeletal, HENT, Endocrine, Cardiovascular, Eyes, Respiratory, Psychiatric, Allergic/Imm, Heme/Lymph Last edited by [name], MD on 08/01/2021  3:28 PM.       ALLERGIES No Known Allergies  PAST MEDICAL HISTORY Past Medical History:  Diagnosis Date   Acid reflux    Anxiety    Choroidal neovascularization of right eye 08/05/2019   Hyperlipidemia    Hypertension    Past Surgical History:  Procedure Laterality Date   ANTERIOR AND POSTERIOR REPAIR N/A 09/06/2017   Procedure: ANTERIOR (CYSTOCELE) AND POSTERIOR REPAIR  (RECTOCELE);  Surgeon: Lavina Hamman, MD;  Location: [facility];  Service: Gynecology;  Laterality: N/A;   cataract surgery      bilateral    CYSTOSCOPY N/A 09/06/2017   Procedure: CYSTOSCOPY;  Surgeon: Lavina Hamman, MD;  Location: [facility];  Service: Gynecology;  Laterality: N/A;   VAGINAL HYSTERECTOMY N/A 09/06/2017   Procedure: HYSTERECTOMY VAGINAL;  Surgeon: Lavina Hamman, MD;  Location: [facility];  Service: Gynecology;  Laterality: N/A;   WRIST SURGERY      FAMILY HISTORY Family History  Problem Relation Age of Onset   Hypertension Mother    Heart disease Brother    Heart attack Brother     SOCIAL HISTORY Social History   Tobacco Use   Smoking status: Never   Smokeless tobacco: Never  Vaping Use   Vaping Use: Never used  Substance Use Topics   Alcohol use: Not Currently    Comment: Rarely   Drug use: Never         OPHTHALMIC EXAM:  Base Eye Exam     Visual Acuity (ETDRS)       Right Left   Dist cc CF at 3' 20/25 -2   Dist ph cc NI     Correction: Glasses         Tonometry (Tonopen, 2:56 PM)       Right Left   Pressure 12 12         Pupils       Pupils Dark Light  Right PERRL 4 3   Left PERRL 4 3         Extraocular Movement       Right Left    Full Full         Neuro/Psych     Oriented x3: Yes   Mood/Affect: Normal         Dilation     Both eyes: 1.0% Mydriacyl, 2.5% Phenylephrine @ 2:56 PM           Slit Lamp and Fundus Exam     External Exam       Right Left   External Normal Normal         Slit Lamp Exam       Right Left   Lids/Lashes Normal Normal   Conjunctiva/Sclera White and quiet White and quiet   Cornea Clear Clear   Anterior Chamber Deep and quiet Deep and quiet   Iris Round and reactive Round and reactive   Lens Centered posterior chamber intraocular lens Centered posterior chamber intraocular lens   Anterior Vitreous Normal Normal          Fundus Exam       Right Left   Posterior Vitreous Posterior vitreous detachment, Central vitreous floaters Posterior vitreous detachment   Disc Normal Normal   C/D Ratio 0.05 0.05   Macula Geographic atrophy, no macular thickening, no hemorrhage, no exudates Retinal pigment epithelial mottling, Hard drusen, no hemorrhage, no macular thickening   Vessels Normal Normal   Periphery Normal Normal            IMAGING AND PROCEDURES  Imaging and Procedures for 08/01/21  OCT, Retina - OU - Both Eyes       Right Eye Quality was good. Scan locations included subfoveal. Central Foveal Thickness: 351. Progression has been stable. Findings include no IRF, no SRF, abnormal foveal contour, retinal drusen , subretinal scarring.   Left Eye Quality was good. Scan locations included subfoveal. Central Foveal Thickness: 287. Progression has been stable. Findings include no IRF, no SRF, retinal drusen .   Notes No active CNVM lesion OU.  Large central disciform scar OD with no active edges to this lesion.              ASSESSMENT/PLAN:  Intermediate stage nonexudative age-related macular degeneration of left eye OS no significant findings of geographic atrophy in the macular region  Exudative age-related macular degeneration of right eye with inactive choroidal neovascularization (HCC) No sign of active CNVM  Advanced nonexudative age-related macular degeneration of right eye with subfoveal involvement Extensive subfoveal geographic atrophy OD.     ICD-10-CM   1. Exudative age-related macular degeneration of right eye with inactive choroidal neovascularization (HCC)  H35.3212 OCT, Retina - OU - Both Eyes    2. Intermediate stage nonexudative age-related macular degeneration of left eye  H35.3122     3. Advanced nonexudative age-related macular degeneration of right eye with subfoveal involvement  H35.3114       1.  OU, with ARMD.  OD with extensive geographic atrophy  after CNVM formation now inactive disciform scar OD  2.  OS with intermediate ARMD, no sign of CNVM no sign of disease activity we will continue to monitor and observe OS  3.  Ophthalmic Meds Ordered this visit:  No orders of the defined types were placed in this encounter.      Return in about 1 year (around 08/02/2022) for DILATE OU, COLOR FP, OCT.  There are no Patient  Instructions on file for this visit.   Explained the diagnoses, plan, and follow up with the patient and they expressed understanding.  Patient expressed understanding of the importance of proper follow up care.   Alford Highland Ammiel Guiney M.D. Diseases & Surgery of the Retina and Vitreous [facility] 08/01/21     Abbreviations: M myopia (nearsighted); A astigmatism; H hyperopia (farsighted); P presbyopia; Mrx spectacle prescription;  CTL contact lenses; OD right eye; OS left eye; OU both eyes  XT exotropia; ET esotropia; PEK punctate epithelial keratitis; PEE punctate epithelial erosions; DES dry eye syndrome; MGD meibomian gland dysfunction; ATs artificial tears; PFAT's preservative free artificial tears; NSC nuclear sclerotic cataract; PSC posterior subcapsular cataract; ERM epi-retinal membrane; PVD posterior vitreous detachment; RD retinal detachment; DM diabetes mellitus; DR diabetic retinopathy; NPDR non-proliferative diabetic retinopathy; PDR proliferative diabetic retinopathy; CSME clinically significant macular edema; DME diabetic macular edema; dbh dot blot hemorrhages; CWS cotton wool spot; POAG primary open angle glaucoma; C/D cup-to-disc ratio; HVF humphrey visual field; GVF goldmann visual field; OCT optical coherence tomography; IOP intraocular pressure; BRVO Branch retinal vein occlusion; CRVO central retinal vein occlusion; CRAO central retinal artery occlusion; BRAO branch retinal artery occlusion; RT retinal tear; SB scleral buckle; PPV pars plana vitrectomy; VH Vitreous hemorrhage; PRP  panretinal laser photocoagulation; IVK intravitreal kenalog; VMT vitreomacular traction; MH Macular hole;  NVD neovascularization of the disc; NVE neovascularization elsewhere; AREDS age related eye disease study; ARMD age related macular degeneration; POAG primary open angle glaucoma; EBMD epithelial/anterior basement membrane dystrophy; ACIOL anterior chamber intraocular lens; IOL intraocular lens; PCIOL posterior chamber intraocular lens; Phaco/IOL phacoemulsification with intraocular lens placement; PRK photorefractive keratectomy; LASIK laser assisted in situ keratomileusis; HTN hypertension; DM diabetes mellitus; COPD chronic obstructive pulmonary disease

## 2021-09-15 DIAGNOSIS — E7800 Pure hypercholesterolemia, unspecified: Secondary | ICD-10-CM | POA: Diagnosis not present

## 2021-09-15 DIAGNOSIS — R7303 Prediabetes: Secondary | ICD-10-CM | POA: Diagnosis not present

## 2021-09-22 DIAGNOSIS — Z0000 Encounter for general adult medical examination without abnormal findings: Secondary | ICD-10-CM | POA: Diagnosis not present

## 2021-09-22 DIAGNOSIS — F419 Anxiety disorder, unspecified: Secondary | ICD-10-CM | POA: Diagnosis not present

## 2021-09-22 DIAGNOSIS — H3530 Unspecified macular degeneration: Secondary | ICD-10-CM | POA: Diagnosis not present

## 2021-09-22 DIAGNOSIS — R7303 Prediabetes: Secondary | ICD-10-CM | POA: Diagnosis not present

## 2021-09-22 DIAGNOSIS — E7800 Pure hypercholesterolemia, unspecified: Secondary | ICD-10-CM | POA: Diagnosis not present

## 2021-09-22 DIAGNOSIS — I10 Essential (primary) hypertension: Secondary | ICD-10-CM | POA: Diagnosis not present

## 2021-09-22 DIAGNOSIS — I493 Ventricular premature depolarization: Secondary | ICD-10-CM | POA: Diagnosis not present

## 2021-10-26 ENCOUNTER — Other Ambulatory Visit: Payer: Self-pay

## 2021-10-26 DIAGNOSIS — E782 Mixed hyperlipidemia: Secondary | ICD-10-CM

## 2022-08-03 ENCOUNTER — Encounter (INDEPENDENT_AMBULATORY_CARE_PROVIDER_SITE_OTHER): Payer: HMO | Admitting: Ophthalmology

## 2022-08-03 DIAGNOSIS — H353122 Nonexudative age-related macular degeneration, left eye, intermediate dry stage: Secondary | ICD-10-CM | POA: Diagnosis not present

## 2022-08-03 DIAGNOSIS — H26493 Other secondary cataract, bilateral: Secondary | ICD-10-CM | POA: Diagnosis not present

## 2022-08-03 DIAGNOSIS — H353212 Exudative age-related macular degeneration, right eye, with inactive choroidal neovascularization: Secondary | ICD-10-CM | POA: Diagnosis not present

## 2022-08-03 DIAGNOSIS — H43813 Vitreous degeneration, bilateral: Secondary | ICD-10-CM | POA: Diagnosis not present

## 2022-08-03 DIAGNOSIS — H353114 Nonexudative age-related macular degeneration, right eye, advanced atrophic with subfoveal involvement: Secondary | ICD-10-CM | POA: Diagnosis not present

## 2022-10-03 DIAGNOSIS — Z13220 Encounter for screening for lipoid disorders: Secondary | ICD-10-CM | POA: Diagnosis not present

## 2022-10-03 DIAGNOSIS — Z79899 Other long term (current) drug therapy: Secondary | ICD-10-CM | POA: Diagnosis not present

## 2022-10-03 DIAGNOSIS — R946 Abnormal results of thyroid function studies: Secondary | ICD-10-CM | POA: Diagnosis not present

## 2022-10-03 DIAGNOSIS — R7309 Other abnormal glucose: Secondary | ICD-10-CM | POA: Diagnosis not present

## 2022-10-10 DIAGNOSIS — I493 Ventricular premature depolarization: Secondary | ICD-10-CM | POA: Diagnosis not present

## 2022-10-10 DIAGNOSIS — H3530 Unspecified macular degeneration: Secondary | ICD-10-CM | POA: Diagnosis not present

## 2022-10-10 DIAGNOSIS — E7800 Pure hypercholesterolemia, unspecified: Secondary | ICD-10-CM | POA: Diagnosis not present

## 2022-10-10 DIAGNOSIS — Z0000 Encounter for general adult medical examination without abnormal findings: Secondary | ICD-10-CM | POA: Diagnosis not present

## 2022-10-10 DIAGNOSIS — R7303 Prediabetes: Secondary | ICD-10-CM | POA: Diagnosis not present

## 2022-10-10 DIAGNOSIS — I10 Essential (primary) hypertension: Secondary | ICD-10-CM | POA: Diagnosis not present

## 2022-10-10 DIAGNOSIS — F419 Anxiety disorder, unspecified: Secondary | ICD-10-CM | POA: Diagnosis not present

## 2023-08-06 DIAGNOSIS — H353114 Nonexudative age-related macular degeneration, right eye, advanced atrophic with subfoveal involvement: Secondary | ICD-10-CM | POA: Diagnosis not present

## 2023-08-06 DIAGNOSIS — H26493 Other secondary cataract, bilateral: Secondary | ICD-10-CM | POA: Diagnosis not present

## 2023-08-06 DIAGNOSIS — H353211 Exudative age-related macular degeneration, right eye, with active choroidal neovascularization: Secondary | ICD-10-CM | POA: Diagnosis not present

## 2023-08-06 DIAGNOSIS — H353122 Nonexudative age-related macular degeneration, left eye, intermediate dry stage: Secondary | ICD-10-CM | POA: Diagnosis not present

## 2023-08-06 DIAGNOSIS — H43813 Vitreous degeneration, bilateral: Secondary | ICD-10-CM | POA: Diagnosis not present

## 2023-10-04 DIAGNOSIS — R946 Abnormal results of thyroid function studies: Secondary | ICD-10-CM | POA: Diagnosis not present

## 2023-10-04 DIAGNOSIS — I10 Essential (primary) hypertension: Secondary | ICD-10-CM | POA: Diagnosis not present

## 2023-10-04 DIAGNOSIS — E7800 Pure hypercholesterolemia, unspecified: Secondary | ICD-10-CM | POA: Diagnosis not present

## 2023-10-04 DIAGNOSIS — R7303 Prediabetes: Secondary | ICD-10-CM | POA: Diagnosis not present

## 2023-10-16 DIAGNOSIS — Z0000 Encounter for general adult medical examination without abnormal findings: Secondary | ICD-10-CM | POA: Diagnosis not present

## 2023-10-16 DIAGNOSIS — M542 Cervicalgia: Secondary | ICD-10-CM | POA: Diagnosis not present

## 2023-10-16 DIAGNOSIS — R8271 Bacteriuria: Secondary | ICD-10-CM | POA: Diagnosis not present

## 2023-10-16 DIAGNOSIS — F419 Anxiety disorder, unspecified: Secondary | ICD-10-CM | POA: Diagnosis not present

## 2023-10-16 DIAGNOSIS — I10 Essential (primary) hypertension: Secondary | ICD-10-CM | POA: Diagnosis not present

## 2023-10-16 DIAGNOSIS — R7303 Prediabetes: Secondary | ICD-10-CM | POA: Diagnosis not present

## 2023-11-05 DIAGNOSIS — H353122 Nonexudative age-related macular degeneration, left eye, intermediate dry stage: Secondary | ICD-10-CM | POA: Diagnosis not present

## 2023-11-05 DIAGNOSIS — H353211 Exudative age-related macular degeneration, right eye, with active choroidal neovascularization: Secondary | ICD-10-CM | POA: Diagnosis not present

## 2023-11-05 DIAGNOSIS — H353114 Nonexudative age-related macular degeneration, right eye, advanced atrophic with subfoveal involvement: Secondary | ICD-10-CM | POA: Diagnosis not present

## 2023-11-05 DIAGNOSIS — H43813 Vitreous degeneration, bilateral: Secondary | ICD-10-CM | POA: Diagnosis not present

## 2023-11-05 DIAGNOSIS — H26493 Other secondary cataract, bilateral: Secondary | ICD-10-CM | POA: Diagnosis not present
